# Patient Record
Sex: Male | Born: 1950 | Race: White | Hispanic: No | Marital: Married | Smoking: Former smoker
Health system: Southern US, Community
[De-identification: ages and names within clinical notes are randomized; demographics above are authoritative.]

## PROBLEM LIST (undated history)

## (undated) DIAGNOSIS — Z8659 Personal history of other mental and behavioral disorders: Secondary | ICD-10-CM

## (undated) DIAGNOSIS — E538 Deficiency of other specified B group vitamins: Secondary | ICD-10-CM

## (undated) DIAGNOSIS — F419 Anxiety disorder, unspecified: Secondary | ICD-10-CM

## (undated) DIAGNOSIS — N400 Enlarged prostate without lower urinary tract symptoms: Secondary | ICD-10-CM

## (undated) DIAGNOSIS — F0280 Dementia in other diseases classified elsewhere without behavioral disturbance: Secondary | ICD-10-CM

## (undated) HISTORY — DX: Benign prostatic hyperplasia without lower urinary tract symptoms: N40.0

## (undated) HISTORY — DX: Deficiency of other specified B group vitamins: E53.8

## (undated) HISTORY — PX: HERNIA REPAIR: SHX51

## (undated) HISTORY — DX: Anxiety disorder, unspecified: F41.9

## (undated) HISTORY — DX: Personal history of other mental and behavioral disorders: Z86.59

## (undated) HISTORY — PX: SKIN CANCER EXCISION: SHX779

## (undated) HISTORY — DX: Dementia in other diseases classified elsewhere, unspecified severity, without behavioral disturbance, psychotic disturbance, mood disturbance, and anxiety: F02.80

## (undated) HISTORY — PX: CYST EXCISION: SHX5701

---

## 2013-05-24 ENCOUNTER — Emergency Department: Payer: Self-pay | Admitting: Internal Medicine

## 2013-05-24 LAB — URINALYSIS, COMPLETE
Bacteria: NONE SEEN
Bilirubin,UR: NEGATIVE
Blood: NEGATIVE
Nitrite: NEGATIVE
Protein: NEGATIVE
RBC,UR: <1 /HPF (ref 0–5)
Specific Gravity: 1.021 (ref 1.003–1.030)
Squamous Epithelial: NONE SEEN

## 2019-07-09 DIAGNOSIS — H5202 Hypermetropia, left eye: Secondary | ICD-10-CM | POA: Diagnosis not present

## 2019-07-09 DIAGNOSIS — Z0100 Encounter for examination of eyes and vision without abnormal findings: Secondary | ICD-10-CM | POA: Diagnosis not present

## 2019-08-10 DIAGNOSIS — Z0000 Encounter for general adult medical examination without abnormal findings: Secondary | ICD-10-CM | POA: Diagnosis not present

## 2019-08-10 DIAGNOSIS — N529 Male erectile dysfunction, unspecified: Secondary | ICD-10-CM | POA: Diagnosis not present

## 2019-08-10 DIAGNOSIS — Z87891 Personal history of nicotine dependence: Secondary | ICD-10-CM | POA: Diagnosis not present

## 2019-08-10 DIAGNOSIS — E785 Hyperlipidemia, unspecified: Secondary | ICD-10-CM | POA: Diagnosis not present

## 2019-08-10 DIAGNOSIS — Z7289 Other problems related to lifestyle: Secondary | ICD-10-CM | POA: Diagnosis not present

## 2019-08-10 DIAGNOSIS — Z125 Encounter for screening for malignant neoplasm of prostate: Secondary | ICD-10-CM | POA: Diagnosis not present

## 2019-08-10 DIAGNOSIS — N400 Enlarged prostate without lower urinary tract symptoms: Secondary | ICD-10-CM | POA: Diagnosis not present

## 2020-08-11 DIAGNOSIS — E785 Hyperlipidemia, unspecified: Secondary | ICD-10-CM | POA: Diagnosis not present

## 2020-08-11 DIAGNOSIS — Z125 Encounter for screening for malignant neoplasm of prostate: Secondary | ICD-10-CM | POA: Diagnosis not present

## 2020-08-11 DIAGNOSIS — R413 Other amnesia: Secondary | ICD-10-CM | POA: Diagnosis not present

## 2020-08-11 DIAGNOSIS — F1021 Alcohol dependence, in remission: Secondary | ICD-10-CM | POA: Diagnosis not present

## 2020-08-11 DIAGNOSIS — N529 Male erectile dysfunction, unspecified: Secondary | ICD-10-CM | POA: Diagnosis not present

## 2020-08-11 DIAGNOSIS — N400 Enlarged prostate without lower urinary tract symptoms: Secondary | ICD-10-CM | POA: Diagnosis not present

## 2020-08-11 DIAGNOSIS — Z0000 Encounter for general adult medical examination without abnormal findings: Secondary | ICD-10-CM | POA: Diagnosis not present

## 2020-09-18 DIAGNOSIS — Z8659 Personal history of other mental and behavioral disorders: Secondary | ICD-10-CM | POA: Diagnosis not present

## 2020-09-18 DIAGNOSIS — F419 Anxiety disorder, unspecified: Secondary | ICD-10-CM | POA: Diagnosis not present

## 2020-09-18 DIAGNOSIS — R413 Other amnesia: Secondary | ICD-10-CM | POA: Diagnosis not present

## 2020-09-21 DIAGNOSIS — F419 Anxiety disorder, unspecified: Secondary | ICD-10-CM | POA: Insufficient documentation

## 2020-11-03 DIAGNOSIS — H5213 Myopia, bilateral: Secondary | ICD-10-CM | POA: Diagnosis not present

## 2020-11-06 DIAGNOSIS — Z8659 Personal history of other mental and behavioral disorders: Secondary | ICD-10-CM | POA: Diagnosis not present

## 2020-11-06 DIAGNOSIS — F419 Anxiety disorder, unspecified: Secondary | ICD-10-CM | POA: Diagnosis not present

## 2020-11-06 DIAGNOSIS — R413 Other amnesia: Secondary | ICD-10-CM | POA: Diagnosis not present

## 2020-11-10 DIAGNOSIS — H02835 Dermatochalasis of left lower eyelid: Secondary | ICD-10-CM | POA: Diagnosis not present

## 2020-11-10 DIAGNOSIS — H02831 Dermatochalasis of right upper eyelid: Secondary | ICD-10-CM | POA: Diagnosis not present

## 2020-11-10 DIAGNOSIS — H02832 Dermatochalasis of right lower eyelid: Secondary | ICD-10-CM | POA: Diagnosis not present

## 2020-11-10 DIAGNOSIS — H02834 Dermatochalasis of left upper eyelid: Secondary | ICD-10-CM | POA: Diagnosis not present

## 2020-11-10 DIAGNOSIS — H25813 Combined forms of age-related cataract, bilateral: Secondary | ICD-10-CM | POA: Diagnosis not present

## 2021-01-07 DIAGNOSIS — H2512 Age-related nuclear cataract, left eye: Secondary | ICD-10-CM | POA: Diagnosis not present

## 2021-01-07 DIAGNOSIS — H25012 Cortical age-related cataract, left eye: Secondary | ICD-10-CM | POA: Diagnosis not present

## 2021-01-07 DIAGNOSIS — H52222 Regular astigmatism, left eye: Secondary | ICD-10-CM | POA: Diagnosis not present

## 2021-01-08 DIAGNOSIS — H2512 Age-related nuclear cataract, left eye: Secondary | ICD-10-CM | POA: Diagnosis not present

## 2021-01-28 DIAGNOSIS — H2511 Age-related nuclear cataract, right eye: Secondary | ICD-10-CM | POA: Diagnosis not present

## 2021-01-28 DIAGNOSIS — H52221 Regular astigmatism, right eye: Secondary | ICD-10-CM | POA: Diagnosis not present

## 2021-02-09 DIAGNOSIS — Z8659 Personal history of other mental and behavioral disorders: Secondary | ICD-10-CM | POA: Diagnosis not present

## 2021-02-09 DIAGNOSIS — R413 Other amnesia: Secondary | ICD-10-CM | POA: Diagnosis not present

## 2021-02-09 DIAGNOSIS — F419 Anxiety disorder, unspecified: Secondary | ICD-10-CM | POA: Diagnosis not present

## 2021-03-23 DIAGNOSIS — E538 Deficiency of other specified B group vitamins: Secondary | ICD-10-CM | POA: Insufficient documentation

## 2021-07-27 DIAGNOSIS — F419 Anxiety disorder, unspecified: Secondary | ICD-10-CM | POA: Diagnosis not present

## 2021-07-27 DIAGNOSIS — R413 Other amnesia: Secondary | ICD-10-CM | POA: Diagnosis not present

## 2021-07-27 DIAGNOSIS — Z8659 Personal history of other mental and behavioral disorders: Secondary | ICD-10-CM | POA: Diagnosis not present

## 2021-07-27 DIAGNOSIS — E538 Deficiency of other specified B group vitamins: Secondary | ICD-10-CM | POA: Diagnosis not present

## 2021-07-30 ENCOUNTER — Other Ambulatory Visit: Payer: Self-pay | Admitting: Neurology

## 2021-07-30 DIAGNOSIS — R413 Other amnesia: Secondary | ICD-10-CM

## 2021-08-12 DIAGNOSIS — N400 Enlarged prostate without lower urinary tract symptoms: Secondary | ICD-10-CM | POA: Diagnosis not present

## 2021-08-12 DIAGNOSIS — R413 Other amnesia: Secondary | ICD-10-CM | POA: Diagnosis not present

## 2021-08-12 DIAGNOSIS — E785 Hyperlipidemia, unspecified: Secondary | ICD-10-CM | POA: Diagnosis not present

## 2021-08-12 DIAGNOSIS — N529 Male erectile dysfunction, unspecified: Secondary | ICD-10-CM | POA: Diagnosis not present

## 2021-08-12 DIAGNOSIS — Z1389 Encounter for screening for other disorder: Secondary | ICD-10-CM | POA: Diagnosis not present

## 2021-08-12 DIAGNOSIS — F419 Anxiety disorder, unspecified: Secondary | ICD-10-CM | POA: Diagnosis not present

## 2021-08-12 DIAGNOSIS — Z125 Encounter for screening for malignant neoplasm of prostate: Secondary | ICD-10-CM | POA: Diagnosis not present

## 2021-08-12 DIAGNOSIS — Z0000 Encounter for general adult medical examination without abnormal findings: Secondary | ICD-10-CM | POA: Diagnosis not present

## 2021-08-12 DIAGNOSIS — F1021 Alcohol dependence, in remission: Secondary | ICD-10-CM | POA: Diagnosis not present

## 2021-08-14 ENCOUNTER — Ambulatory Visit
Admission: RE | Admit: 2021-08-14 | Discharge: 2021-08-14 | Disposition: A | Discharge status: Home / Self Care | Payer: Medicare HMO | Source: Ambulatory Visit | Attending: Neurology | Admitting: Neurology

## 2021-08-14 DIAGNOSIS — I6782 Cerebral ischemia: Secondary | ICD-10-CM | POA: Diagnosis not present

## 2021-08-14 DIAGNOSIS — R413 Other amnesia: Secondary | ICD-10-CM | POA: Diagnosis not present

## 2021-08-14 DIAGNOSIS — G319 Degenerative disease of nervous system, unspecified: Secondary | ICD-10-CM | POA: Diagnosis not present

## 2021-08-14 DIAGNOSIS — J329 Chronic sinusitis, unspecified: Secondary | ICD-10-CM | POA: Diagnosis not present

## 2021-10-19 DIAGNOSIS — Z8659 Personal history of other mental and behavioral disorders: Secondary | ICD-10-CM | POA: Diagnosis not present

## 2021-10-19 DIAGNOSIS — F419 Anxiety disorder, unspecified: Secondary | ICD-10-CM | POA: Diagnosis not present

## 2021-10-19 DIAGNOSIS — R413 Other amnesia: Secondary | ICD-10-CM | POA: Diagnosis not present

## 2021-10-19 DIAGNOSIS — E538 Deficiency of other specified B group vitamins: Secondary | ICD-10-CM | POA: Diagnosis not present

## 2021-12-10 DIAGNOSIS — H6121 Impacted cerumen, right ear: Secondary | ICD-10-CM | POA: Diagnosis not present

## 2021-12-10 DIAGNOSIS — J320 Chronic maxillary sinusitis: Secondary | ICD-10-CM | POA: Diagnosis not present

## 2021-12-15 ENCOUNTER — Other Ambulatory Visit: Payer: Self-pay | Admitting: Unknown Physician Specialty

## 2021-12-15 DIAGNOSIS — J320 Chronic maxillary sinusitis: Secondary | ICD-10-CM

## 2022-01-01 ENCOUNTER — Ambulatory Visit
Admission: RE | Admit: 2022-01-01 | Discharge: 2022-01-01 | Disposition: A | Discharge status: Home / Self Care | Payer: Medicare HMO | Source: Ambulatory Visit | Attending: Unknown Physician Specialty | Admitting: Unknown Physician Specialty

## 2022-01-01 DIAGNOSIS — J320 Chronic maxillary sinusitis: Secondary | ICD-10-CM | POA: Diagnosis not present

## 2022-01-11 DIAGNOSIS — J320 Chronic maxillary sinusitis: Secondary | ICD-10-CM | POA: Diagnosis not present

## 2022-01-18 DIAGNOSIS — E538 Deficiency of other specified B group vitamins: Secondary | ICD-10-CM | POA: Diagnosis not present

## 2022-01-18 DIAGNOSIS — R413 Other amnesia: Secondary | ICD-10-CM | POA: Diagnosis not present

## 2022-01-18 DIAGNOSIS — F419 Anxiety disorder, unspecified: Secondary | ICD-10-CM | POA: Diagnosis not present

## 2022-01-18 DIAGNOSIS — Z8659 Personal history of other mental and behavioral disorders: Secondary | ICD-10-CM | POA: Diagnosis not present

## 2022-02-22 DIAGNOSIS — Z0100 Encounter for examination of eyes and vision without abnormal findings: Secondary | ICD-10-CM | POA: Diagnosis not present

## 2022-02-22 DIAGNOSIS — H5203 Hypermetropia, bilateral: Secondary | ICD-10-CM | POA: Diagnosis not present

## 2022-05-19 DIAGNOSIS — E538 Deficiency of other specified B group vitamins: Secondary | ICD-10-CM | POA: Diagnosis not present

## 2022-05-19 DIAGNOSIS — G3183 Dementia with Lewy bodies: Secondary | ICD-10-CM | POA: Diagnosis not present

## 2022-05-19 DIAGNOSIS — F02A2 Dementia in other diseases classified elsewhere, mild, with psychotic disturbance: Secondary | ICD-10-CM | POA: Diagnosis not present

## 2022-06-23 DIAGNOSIS — Z8659 Personal history of other mental and behavioral disorders: Secondary | ICD-10-CM | POA: Diagnosis not present

## 2022-06-23 DIAGNOSIS — F419 Anxiety disorder, unspecified: Secondary | ICD-10-CM | POA: Diagnosis not present

## 2022-06-23 DIAGNOSIS — R413 Other amnesia: Secondary | ICD-10-CM | POA: Diagnosis not present

## 2022-06-23 DIAGNOSIS — G4752 REM sleep behavior disorder: Secondary | ICD-10-CM | POA: Diagnosis not present

## 2022-08-18 DIAGNOSIS — F1021 Alcohol dependence, in remission: Secondary | ICD-10-CM | POA: Diagnosis not present

## 2022-08-18 DIAGNOSIS — Z125 Encounter for screening for malignant neoplasm of prostate: Secondary | ICD-10-CM | POA: Diagnosis not present

## 2022-08-18 DIAGNOSIS — N400 Enlarged prostate without lower urinary tract symptoms: Secondary | ICD-10-CM | POA: Diagnosis not present

## 2022-08-18 DIAGNOSIS — Z1331 Encounter for screening for depression: Secondary | ICD-10-CM | POA: Diagnosis not present

## 2022-08-18 DIAGNOSIS — E785 Hyperlipidemia, unspecified: Secondary | ICD-10-CM | POA: Diagnosis not present

## 2022-08-18 DIAGNOSIS — G309 Alzheimer's disease, unspecified: Secondary | ICD-10-CM | POA: Diagnosis not present

## 2022-08-18 DIAGNOSIS — Z0000 Encounter for general adult medical examination without abnormal findings: Secondary | ICD-10-CM | POA: Diagnosis not present

## 2022-08-18 DIAGNOSIS — N529 Male erectile dysfunction, unspecified: Secondary | ICD-10-CM | POA: Diagnosis not present

## 2022-08-18 DIAGNOSIS — K5900 Constipation, unspecified: Secondary | ICD-10-CM | POA: Diagnosis not present

## 2022-09-21 DIAGNOSIS — Z8659 Personal history of other mental and behavioral disorders: Secondary | ICD-10-CM | POA: Diagnosis not present

## 2022-09-21 DIAGNOSIS — E538 Deficiency of other specified B group vitamins: Secondary | ICD-10-CM | POA: Diagnosis not present

## 2022-09-21 DIAGNOSIS — G4752 REM sleep behavior disorder: Secondary | ICD-10-CM | POA: Diagnosis not present

## 2022-09-21 DIAGNOSIS — F419 Anxiety disorder, unspecified: Secondary | ICD-10-CM | POA: Diagnosis not present

## 2022-09-21 DIAGNOSIS — R413 Other amnesia: Secondary | ICD-10-CM | POA: Diagnosis not present

## 2022-10-06 DIAGNOSIS — R972 Elevated prostate specific antigen [PSA]: Secondary | ICD-10-CM | POA: Diagnosis not present

## 2022-10-19 DIAGNOSIS — D0439 Carcinoma in situ of skin of other parts of face: Secondary | ICD-10-CM | POA: Diagnosis not present

## 2022-10-19 DIAGNOSIS — D485 Neoplasm of uncertain behavior of skin: Secondary | ICD-10-CM | POA: Diagnosis not present

## 2022-10-19 DIAGNOSIS — C4441 Basal cell carcinoma of skin of scalp and neck: Secondary | ICD-10-CM | POA: Diagnosis not present

## 2022-10-25 DIAGNOSIS — Z1211 Encounter for screening for malignant neoplasm of colon: Secondary | ICD-10-CM | POA: Diagnosis not present

## 2022-11-01 LAB — COLOGUARD: COLOGUARD: NEGATIVE

## 2022-11-01 LAB — EXTERNAL GENERIC LAB PROCEDURE: COLOGUARD: NEGATIVE

## 2022-11-10 DIAGNOSIS — C44329 Squamous cell carcinoma of skin of other parts of face: Secondary | ICD-10-CM | POA: Diagnosis not present

## 2022-11-10 DIAGNOSIS — D0439 Carcinoma in situ of skin of other parts of face: Secondary | ICD-10-CM | POA: Diagnosis not present

## 2022-11-24 DIAGNOSIS — C4491 Basal cell carcinoma of skin, unspecified: Secondary | ICD-10-CM | POA: Diagnosis not present

## 2022-11-24 DIAGNOSIS — C4441 Basal cell carcinoma of skin of scalp and neck: Secondary | ICD-10-CM | POA: Diagnosis not present

## 2022-11-30 DIAGNOSIS — G3183 Dementia with Lewy bodies: Secondary | ICD-10-CM | POA: Diagnosis not present

## 2022-11-30 DIAGNOSIS — F02A4 Dementia in other diseases classified elsewhere, mild, with anxiety: Secondary | ICD-10-CM | POA: Diagnosis not present

## 2022-11-30 DIAGNOSIS — F419 Anxiety disorder, unspecified: Secondary | ICD-10-CM | POA: Diagnosis not present

## 2022-11-30 DIAGNOSIS — Z636 Dependent relative needing care at home: Secondary | ICD-10-CM | POA: Diagnosis not present

## 2022-11-30 DIAGNOSIS — F0280 Dementia in other diseases classified elsewhere without behavioral disturbance: Secondary | ICD-10-CM | POA: Diagnosis not present

## 2022-11-30 DIAGNOSIS — G309 Alzheimer's disease, unspecified: Secondary | ICD-10-CM | POA: Diagnosis not present

## 2022-11-30 DIAGNOSIS — Z8659 Personal history of other mental and behavioral disorders: Secondary | ICD-10-CM | POA: Diagnosis not present

## 2022-12-07 ENCOUNTER — Encounter: Payer: Self-pay | Admitting: Urology

## 2022-12-07 ENCOUNTER — Ambulatory Visit: Payer: Medicare HMO | Admitting: Urology

## 2022-12-07 VITALS — BP 127/73 | HR 60 | Ht 70.0 in | Wt 200.4 lb

## 2022-12-07 DIAGNOSIS — R972 Elevated prostate specific antigen [PSA]: Secondary | ICD-10-CM | POA: Diagnosis not present

## 2022-12-07 DIAGNOSIS — R350 Frequency of micturition: Secondary | ICD-10-CM | POA: Diagnosis not present

## 2022-12-07 LAB — URINALYSIS, COMPLETE
Bilirubin, UA: NEGATIVE
Glucose, UA: NEGATIVE
Ketones, UA: NEGATIVE
Leukocytes,UA: NEGATIVE
Nitrite, UA: NEGATIVE
RBC, UA: NEGATIVE
Specific Gravity, UA: 1.02 (ref 1.005–1.030)
Urobilinogen, Ur: 1 mg/dL (ref 0.2–1.0)
pH, UA: 7 (ref 5.0–7.5)

## 2022-12-07 LAB — MICROSCOPIC EXAMINATION

## 2022-12-07 NOTE — Progress Notes (Signed)
I,Amy L Pierron,acting as a scribe for Vanna Scotland, MD.,have documented all relevant documentation on the behalf of Vanna Scotland, MD,as directed by  Vanna Scotland, MD while in the presence of Vanna Scotland, MD.  12/07/2022 2:45 PM   John Burch August 17, 1950 [id number]  Referring provider: Marina Goodell, MD [address] [place],  [place] [address]  Chief Complaint  Patient presents with   Establish Care   Elevated PSA    HPI: 72 year-old male referred for further evaluation of elevated PSA.  He had a PSA on 10/06/2022 as part of his routine labs, which was known to be 4.11. Prior to that, it had been checked on 08/18/22, which was 4.25. On 08/12/2021, his PSA was 2.98.   His medical comorbidities include Lewy body dementia.  He's on Sildenafil for erectile dysfunction as well as on Flomax.  He is accompanied by his wife. She said when they returned from a trip to [place] in 2019 his prostate was enlarged and was put on Flomax.   He has urgency and leaking sometimes. Since he started taking Clonazepam his frequency has decreased some.  PMH: Past Medical History:  Diagnosis Date   Anxiety    B12 deficiency    BPH (benign prostatic hyperplasia)    History of depression    Lewy body dementia (HCC)     Surgical History: Past Surgical History:  Procedure Laterality Date   CYST EXCISION     HERNIA REPAIR     SKIN CANCER EXCISION      Home Medications:  Allergies as of 12/07/2022       Reactions   Other    Goose berry        Medication List        Accurate as of December 07, 2022  2:45 PM. If you have any questions, ask your nurse or doctor.          busPIRone 7.5 MG tablet Commonly known as: BUSPAR Take 7.5 mg by mouth 2 (two) times daily.   clonazePAM 0.5 MG tablet Commonly known as: KLONOPIN Take 0.25 mg by mouth at bedtime.   cyanocobalamin 1000 MCG tablet Commonly known as: VITAMIN B12 Take by mouth daily.   donepezil 10 MG  tablet Commonly known as: ARICEPT Take 1 tablet by mouth at bedtime.   escitalopram 20 MG tablet Commonly known as: LEXAPRO Take 20 mg by mouth daily.   LUTEIN PO Take by mouth.   NON FORMULARY Physilium HUSK daily   tamsulosin 0.4 MG Caps capsule Commonly known as: FLOMAX Take 0.4 mg by mouth daily.   Vitamin D3 50 MCG (2000 UT) capsule Take by mouth.        Allergies:  Allergies  Allergen Reactions   Other     Goose berry    Social History:  reports that he has quit smoking. His smoking use included cigarettes. He has never used smokeless tobacco. He reports current alcohol use. He reports that he does not use drugs.   Physical Exam: BP 127/73   Pulse 60   Ht 5\' 10"  (1.778 m)   Wt 200 lb 6 oz (90.9 kg)   BMI 28.75 kg/m   Constitutional:  Alert and oriented, No acute distress. HEENT: [place] AT, moist mucus membranes.  Trachea midline, no masses. GU: Large prostate, no nodules, lumps, or bumps. Neurologic: Grossly intact, no focal deficits, moving all 4 extremities. Psychiatric: Normal mood and affect.  Assessment & Plan:    Elevated PSA  -  We reviewed the implications of an elevated PSA and the uncertainty surrounding it. In general, a man's PSA increases with age and is produced by both normal and cancerous prostate tissue. Differential for elevated PSA is BPH, prostate cancer, infection, recent intercourse/ejaculation, prostate infarction, recent urethroscopic manipulation (foley placement/cystoscopy) and prostatitis. Management of an elevated PSA can include observation or prostate biopsy and wediscussed this in detail. We discussed that indications for prostate biopsy are defined by age and race specific PSA cutoffs as well as a PSA velocity of 0.75/year.  - UA today to check for underlying issues. Prostate size in harmony with a higher PSA.   - On Flomax since 2019 and plan to continue taking it.  2. Urinary frequency  - On occasion; not bothersome at this  time. Discussed the addition of Finasteride if symptoms worsen in the future.   Return in about 1 year (around 12/07/2023) for PSA.    [facility] [address] [place], [place] [address] [phone]

## 2022-12-13 IMAGING — MR MR HEAD W/O CM
12 series · 48 of 48 positions shown · non-contrast
Comparison: No pertinent prior exams available for comparison.

CLINICAL DATA: Provided history: Memory loss. Additional history
provided by scanning technologist: Memory loss, confusion,
depression, vision problems for 2 years.

EXAM:
MRI HEAD WITHOUT CONTRAST
TECHNIQUE: Multiplanar, multiecho pulse sequences of the brain and surrounding
structures were obtained without intravenous contrast.

[Series 5: T1 · sagittal · 4.0mm · 0.78mm/px · 1 of 35 slices shown (1 of 2)]
[im 1/35]
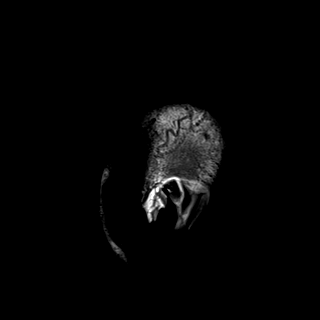

[Series 6: DWI · axial · 3.0mm · 0.98mm/px · z∈[-96,+77]mm · 11 of 196 slices shown (1 of 3)]
[im 1/196]
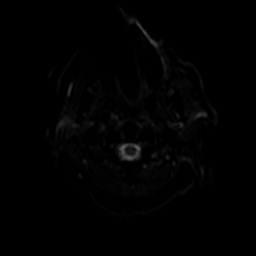
[im 20/196]
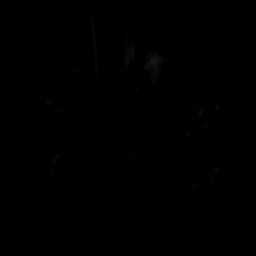
[im 40/196]
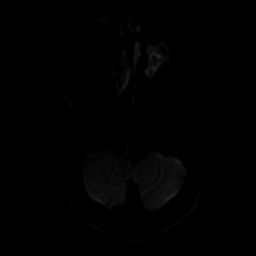
[im 59/196]
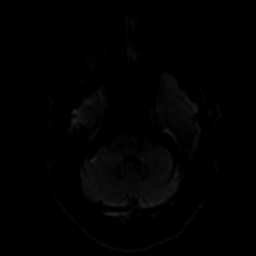
[im 79/196]
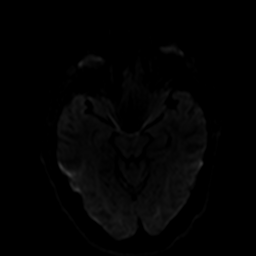
[im 98/196]
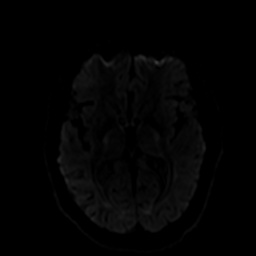
[im 118/196]
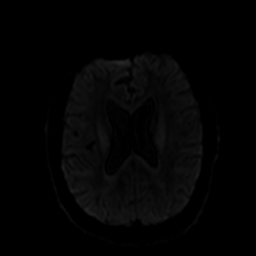
[im 137/196]
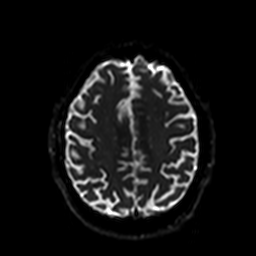
[im 157/196]
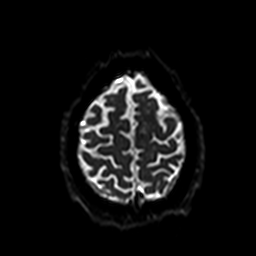
[im 176/196]
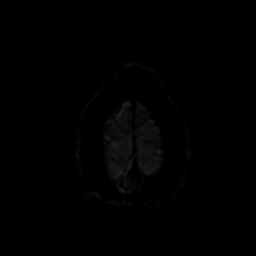
[im 196/196]
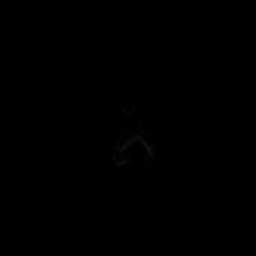

[Series 7: ax dwi_tracew · axial · 3.0mm · 0.98mm/px · z∈[-96,+77]mm · 5 of 98 slices shown]
[im 1/98]
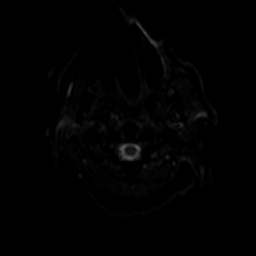
[im 25/98]
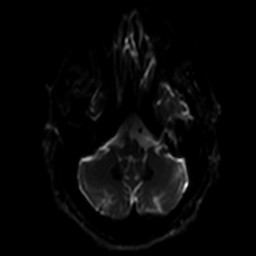
[im 49/98]
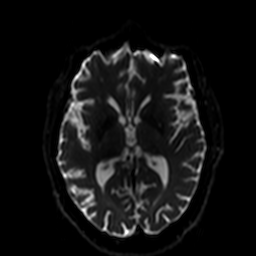
[im 73/98]
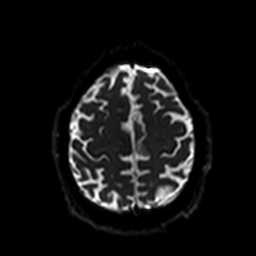
[im 98/98]
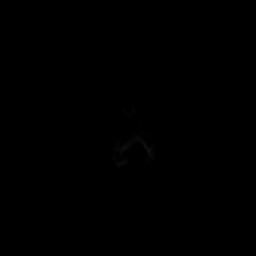

[Series 8: ax dwi_adc · axial · 3.0mm · 0.98mm/px · z∈[-96,+77]mm · 3 of 48 slices shown]
[im 1/48]
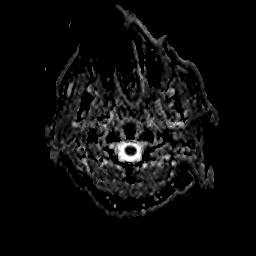
[im 24/48]
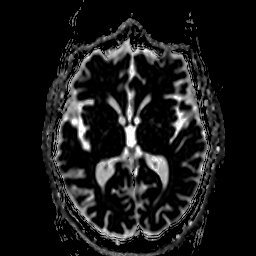
[im 48/48]
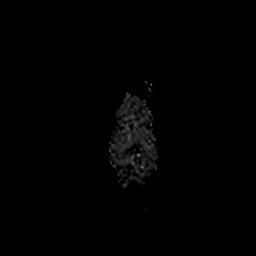

[Series 9: DWI · coronal · 5.0mm · 1.44mm/px · 4 of 74 slices shown (2 of 3)]
[im 1/74]
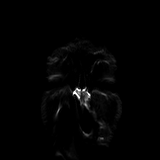
[im 25/74]
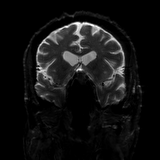
[im 49/74]
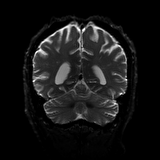
[im 74/74]
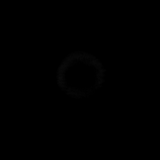

[Series 10: DWI · coronal · 5.0mm · 1.44mm/px · 2 of 37 slices shown (3 of 3)]
[im 1/37]
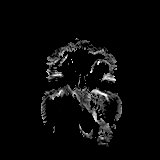
[im 37/37]
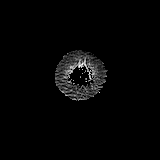

[Series 11: T2 · axial · 4.0mm · 0.39mm/px · z∈[-91,+75]mm · 2 of 33 slices shown (1 of 2)]
[im 1/33]
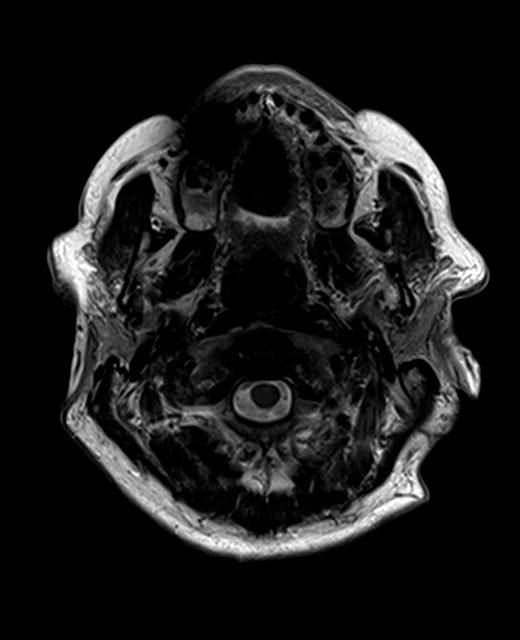
[im 33/33]
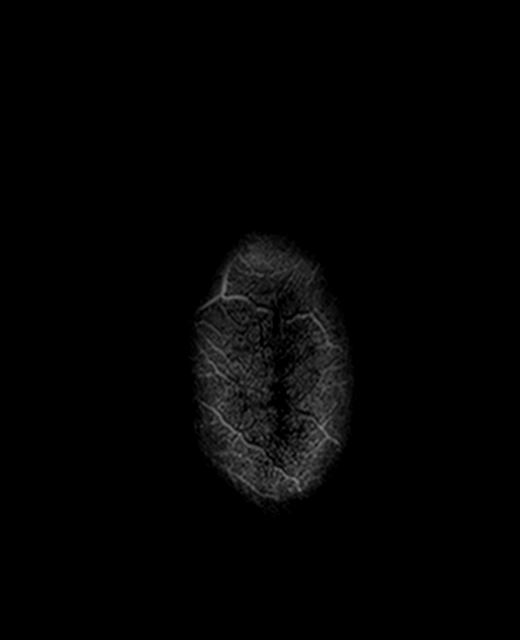

[Series 12: FLAIR · axial · 3.0mm · 0.78mm/px · z∈[-93,+74]mm · 2 of 29 slices shown]
[im 1/29]
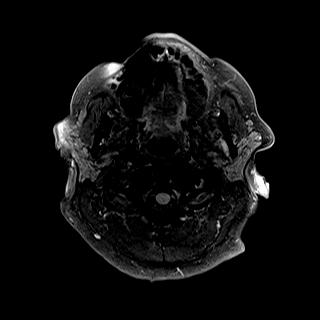
[im 29/29]
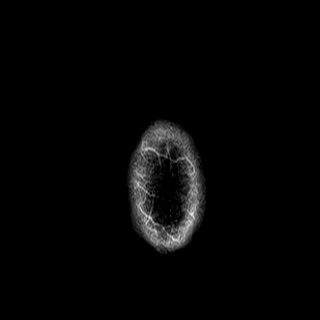

[Series 13: mip_images(sw) · axial · 24.0mm · 0.98mm/px · z∈[-79,+64]mm · 3 of 49 slices shown]
[im 1/49]
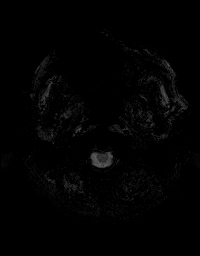
[im 25/49]
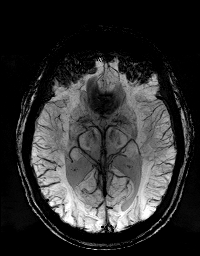
[im 49/49]
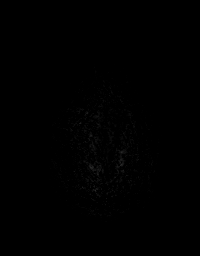

[Series 14: swi_images · axial · 3.0mm · 0.98mm/px · z∈[-90,+75]mm · 3 of 56 slices shown]
[im 1/56]
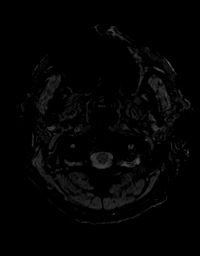
[im 28/56]
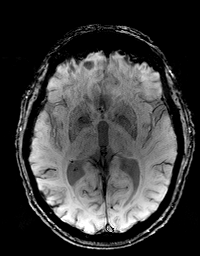
[im 56/56]
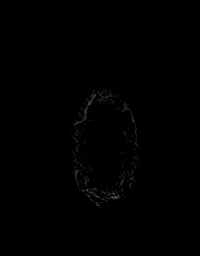

[Series 15: T1 · axial · 1.0mm · 0.98mm/px · z∈[-96,+79]mm · 10 of 176 slices shown (2 of 2)]
[im 1/176]
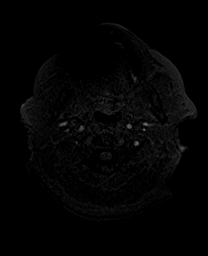
[im 20/176]
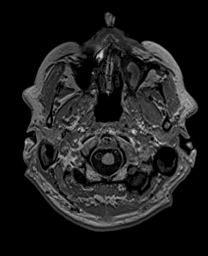
[im 39/176]
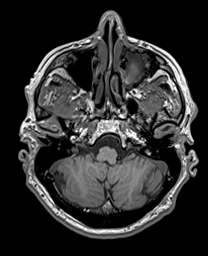
[im 59/176]
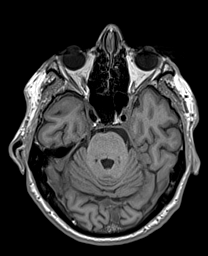
[im 78/176]
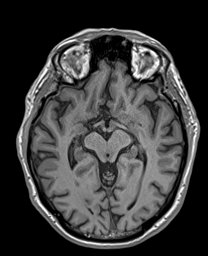
[im 98/176]
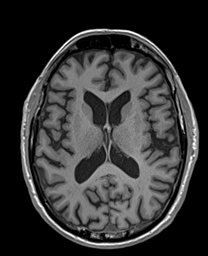
[im 117/176]
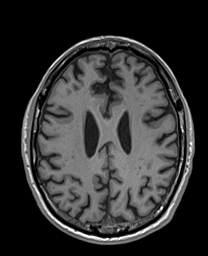
[im 137/176]
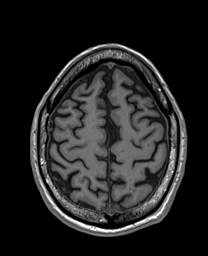
[im 156/176]
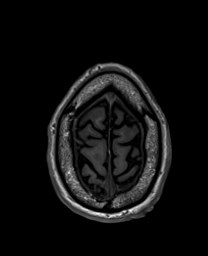
[im 176/176]
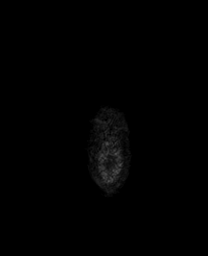

[Series 16: T2 · coronal · 4.5mm · 0.36mm/px · 2 of 33 slices shown (2 of 2)]
[im 1/33]
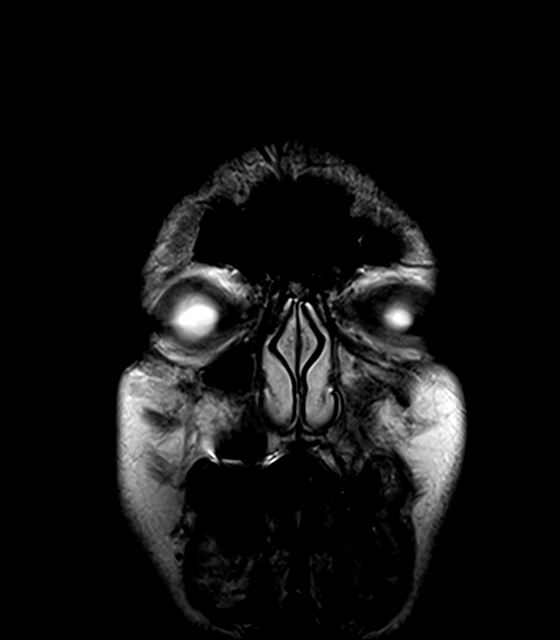
[im 33/33]
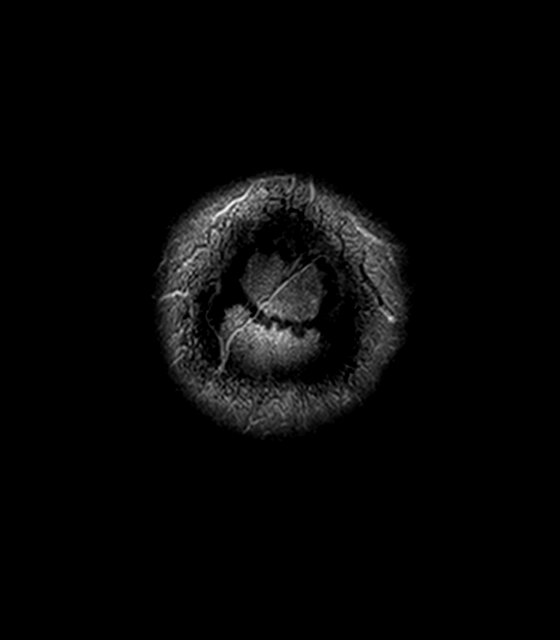

[48 of 48 positions shown; findings below may reference images not displayed]

FINDINGS: Brain:

Mild-to-moderate cerebral atrophy without appreciable lobar
predominance. Comparatively mild cerebellar atrophy.

Mild multifocal T2 FLAIR hyperintense signal abnormality within the
cerebral white matter, nonspecific but compatible with chronic small
vessel ischemic disease.

There is no acute infarct.

No evidence of an intracranial mass.

No chronic intracranial blood products.

No extra-axial fluid collection.

No midline shift.

Vascular: Maintained flow voids within the proximal large arterial
vessels.

Skull and upper cervical spine: No focal suspicious marrow lesion.

Sinuses/Orbits: Visualized orbits show no acute finding. Bilateral
ocular lens replacements. Trace mucosal thickening within the
bilateral ethmoid sinuses. Mild mucosal thickening, and small mucous
retention cyst, within the right maxillary sinus. Complete mixed
signal intensity opacification of the left maxillary sinus with
associated chronic reactive osteitis.

Other: Trace fluid within the bilateral mastoid air cells.
IMPRESSION: No evidence of acute intracranial abnormality.

Mild chronic small-vessel ischemic changes within the cerebral white
matter.

Mild-to-moderate generalized cerebral atrophy. Comparatively mild
cerebellar atrophy.

Paranasal sinus disease, as described.

## 2023-01-13 DIAGNOSIS — L300 Nummular dermatitis: Secondary | ICD-10-CM | POA: Diagnosis not present

## 2023-01-13 DIAGNOSIS — L218 Other seborrheic dermatitis: Secondary | ICD-10-CM | POA: Diagnosis not present

## 2023-01-24 DIAGNOSIS — Z8659 Personal history of other mental and behavioral disorders: Secondary | ICD-10-CM | POA: Diagnosis not present

## 2023-01-24 DIAGNOSIS — R413 Other amnesia: Secondary | ICD-10-CM | POA: Diagnosis not present

## 2023-01-24 DIAGNOSIS — G4752 REM sleep behavior disorder: Secondary | ICD-10-CM | POA: Diagnosis not present

## 2023-01-24 DIAGNOSIS — F419 Anxiety disorder, unspecified: Secondary | ICD-10-CM | POA: Diagnosis not present

## 2023-01-24 DIAGNOSIS — E538 Deficiency of other specified B group vitamins: Secondary | ICD-10-CM | POA: Diagnosis not present

## 2023-03-15 DIAGNOSIS — F419 Anxiety disorder, unspecified: Secondary | ICD-10-CM | POA: Diagnosis not present

## 2023-03-15 DIAGNOSIS — E538 Deficiency of other specified B group vitamins: Secondary | ICD-10-CM | POA: Diagnosis not present

## 2023-03-15 DIAGNOSIS — R441 Visual hallucinations: Secondary | ICD-10-CM | POA: Diagnosis not present

## 2023-03-15 DIAGNOSIS — R413 Other amnesia: Secondary | ICD-10-CM | POA: Diagnosis not present

## 2023-03-15 DIAGNOSIS — G4752 REM sleep behavior disorder: Secondary | ICD-10-CM | POA: Diagnosis not present

## 2023-03-15 DIAGNOSIS — F03918 Unspecified dementia, unspecified severity, with other behavioral disturbance: Secondary | ICD-10-CM | POA: Diagnosis not present

## 2023-03-15 DIAGNOSIS — Z8659 Personal history of other mental and behavioral disorders: Secondary | ICD-10-CM | POA: Diagnosis not present

## 2023-05-02 DIAGNOSIS — L218 Other seborrheic dermatitis: Secondary | ICD-10-CM | POA: Diagnosis not present

## 2023-05-02 DIAGNOSIS — Z872 Personal history of diseases of the skin and subcutaneous tissue: Secondary | ICD-10-CM | POA: Diagnosis not present

## 2023-05-02 DIAGNOSIS — L570 Actinic keratosis: Secondary | ICD-10-CM | POA: Diagnosis not present

## 2023-05-02 DIAGNOSIS — Z859 Personal history of malignant neoplasm, unspecified: Secondary | ICD-10-CM | POA: Diagnosis not present

## 2023-05-02 DIAGNOSIS — L578 Other skin changes due to chronic exposure to nonionizing radiation: Secondary | ICD-10-CM | POA: Diagnosis not present

## 2023-05-02 DIAGNOSIS — Z85828 Personal history of other malignant neoplasm of skin: Secondary | ICD-10-CM | POA: Diagnosis not present

## 2023-05-23 DIAGNOSIS — R441 Visual hallucinations: Secondary | ICD-10-CM | POA: Diagnosis not present

## 2023-05-23 DIAGNOSIS — F03918 Unspecified dementia, unspecified severity, with other behavioral disturbance: Secondary | ICD-10-CM | POA: Diagnosis not present

## 2023-05-23 DIAGNOSIS — Z8659 Personal history of other mental and behavioral disorders: Secondary | ICD-10-CM | POA: Diagnosis not present

## 2023-05-23 DIAGNOSIS — E538 Deficiency of other specified B group vitamins: Secondary | ICD-10-CM | POA: Diagnosis not present

## 2023-05-23 DIAGNOSIS — F02A2 Dementia in other diseases classified elsewhere, mild, with psychotic disturbance: Secondary | ICD-10-CM | POA: Diagnosis not present

## 2023-05-23 DIAGNOSIS — R413 Other amnesia: Secondary | ICD-10-CM | POA: Diagnosis not present

## 2023-05-23 DIAGNOSIS — G3183 Dementia with Lewy bodies: Secondary | ICD-10-CM | POA: Diagnosis not present

## 2023-05-23 DIAGNOSIS — F419 Anxiety disorder, unspecified: Secondary | ICD-10-CM | POA: Diagnosis not present

## 2023-05-23 DIAGNOSIS — G4752 REM sleep behavior disorder: Secondary | ICD-10-CM | POA: Diagnosis not present

## 2023-06-17 DIAGNOSIS — L218 Other seborrheic dermatitis: Secondary | ICD-10-CM | POA: Diagnosis not present

## 2023-06-17 DIAGNOSIS — L570 Actinic keratosis: Secondary | ICD-10-CM | POA: Diagnosis not present

## 2023-08-03 DIAGNOSIS — R413 Other amnesia: Secondary | ICD-10-CM | POA: Diagnosis not present

## 2023-08-03 DIAGNOSIS — Z8659 Personal history of other mental and behavioral disorders: Secondary | ICD-10-CM | POA: Diagnosis not present

## 2023-08-03 DIAGNOSIS — G4752 REM sleep behavior disorder: Secondary | ICD-10-CM | POA: Diagnosis not present

## 2023-08-03 DIAGNOSIS — F02A2 Dementia in other diseases classified elsewhere, mild, with psychotic disturbance: Secondary | ICD-10-CM | POA: Diagnosis not present

## 2023-08-03 DIAGNOSIS — F419 Anxiety disorder, unspecified: Secondary | ICD-10-CM | POA: Diagnosis not present

## 2023-08-03 DIAGNOSIS — E538 Deficiency of other specified B group vitamins: Secondary | ICD-10-CM | POA: Diagnosis not present

## 2023-08-03 DIAGNOSIS — G3183 Dementia with Lewy bodies: Secondary | ICD-10-CM | POA: Diagnosis not present

## 2023-08-03 DIAGNOSIS — R351 Nocturia: Secondary | ICD-10-CM | POA: Diagnosis not present

## 2023-08-22 DIAGNOSIS — E785 Hyperlipidemia, unspecified: Secondary | ICD-10-CM | POA: Diagnosis not present

## 2023-08-22 DIAGNOSIS — Z125 Encounter for screening for malignant neoplasm of prostate: Secondary | ICD-10-CM | POA: Diagnosis not present

## 2023-08-22 DIAGNOSIS — F1021 Alcohol dependence, in remission: Secondary | ICD-10-CM | POA: Diagnosis not present

## 2023-08-22 DIAGNOSIS — Z1331 Encounter for screening for depression: Secondary | ICD-10-CM | POA: Diagnosis not present

## 2023-08-22 DIAGNOSIS — N529 Male erectile dysfunction, unspecified: Secondary | ICD-10-CM | POA: Diagnosis not present

## 2023-08-22 DIAGNOSIS — G309 Alzheimer's disease, unspecified: Secondary | ICD-10-CM | POA: Diagnosis not present

## 2023-08-22 DIAGNOSIS — F10280 Alcohol dependence with alcohol-induced anxiety disorder: Secondary | ICD-10-CM | POA: Diagnosis not present

## 2023-08-22 DIAGNOSIS — N400 Enlarged prostate without lower urinary tract symptoms: Secondary | ICD-10-CM | POA: Diagnosis not present

## 2023-08-22 DIAGNOSIS — Z0000 Encounter for general adult medical examination without abnormal findings: Secondary | ICD-10-CM | POA: Diagnosis not present

## 2023-09-13 DIAGNOSIS — Z8659 Personal history of other mental and behavioral disorders: Secondary | ICD-10-CM | POA: Diagnosis not present

## 2023-09-13 DIAGNOSIS — F419 Anxiety disorder, unspecified: Secondary | ICD-10-CM | POA: Diagnosis not present

## 2023-09-13 DIAGNOSIS — R441 Visual hallucinations: Secondary | ICD-10-CM | POA: Diagnosis not present

## 2023-09-13 DIAGNOSIS — G4752 REM sleep behavior disorder: Secondary | ICD-10-CM | POA: Diagnosis not present

## 2023-09-13 DIAGNOSIS — R413 Other amnesia: Secondary | ICD-10-CM | POA: Diagnosis not present

## 2023-09-15 DIAGNOSIS — L218 Other seborrheic dermatitis: Secondary | ICD-10-CM | POA: Diagnosis not present

## 2023-09-16 ENCOUNTER — Ambulatory Visit (INDEPENDENT_AMBULATORY_CARE_PROVIDER_SITE_OTHER): Payer: Medicare HMO | Admitting: Urology

## 2023-09-16 VITALS — BP 111/65 | HR 78 | Ht 70.0 in | Wt 196.6 lb

## 2023-09-16 DIAGNOSIS — N401 Enlarged prostate with lower urinary tract symptoms: Secondary | ICD-10-CM | POA: Diagnosis not present

## 2023-09-16 DIAGNOSIS — R972 Elevated prostate specific antigen [PSA]: Secondary | ICD-10-CM

## 2023-09-16 DIAGNOSIS — R351 Nocturia: Secondary | ICD-10-CM | POA: Diagnosis not present

## 2023-09-16 NOTE — Progress Notes (Signed)
 Marcelle Overlie Plume,acting as a scribe for Vanna Scotland, MD.,have documented all relevant documentation on the behalf of Vanna Scotland, MD,as directed by  Vanna Scotland, MD while in the presence of Vanna Scotland, MD.  09/16/23 2:53 PM   John Burch 10-Jan-1951 [id number]  Referring provider: Marina Goodell, MD [address] [place],  [place] [address]  Chief Complaint  Patient presents with   Establish Care   Elevated PSA    HPI: 73 y/o male referred for annual follow-up of elevated PSA levels. Comorbidities include Lewy Body dementia, with recent changes in diagnosis to Alzheimer's dementia and vascular dementia. It seems like his disease is progressing based on Dr. Daisy Blossom last note earlier this week. He is experiencing daily visual hallucinations, and there have been recent adjustments to his medications.  He is currently on sildenafil for ED and Flomax for BPH.   His most recent PSA on 08/22/2023 was 6.58, which is up from last year, 4.18. His rectal exam was unremarkable last year other than for enlargement.   His caregiver reports no urinary complaints, with him getting up once a night to urinate. The caregiver is concerned about the long-term use of Tamulosin and is considering a trial off the medication.   His wife today mentions that he seems to be progressing more quickly.  PSA trend: 08/22/2023     6.58 10/06/2022     4.18 08/18/2022     4.25 08/12/2021     2.98 08/11/2020     3.14 08/10/2019     3.36 08/14/2018     2.82 05/11/2018     2.97 08/09/2017     3.63 PMH: Past Medical History:  Diagnosis Date   Anxiety    B12 deficiency    BPH (benign prostatic hyperplasia)    History of depression    Lewy body dementia (HCC)     Surgical History: Past Surgical History:  Procedure Laterality Date   CYST EXCISION     HERNIA REPAIR     SKIN CANCER EXCISION      Home Medications:  Allergies as of 09/16/2023       Reactions   Other    Goose  berry        Medication List        Accurate as of September 16, 2023  2:53 PM. If you have any questions, ask your nurse or doctor.          STOP taking these medications    clonazePAM 0.5 MG tablet Commonly known as: KLONOPIN   LUTEIN PO       TAKE these medications    busPIRone 7.5 MG tablet Commonly known as: BUSPAR Take 7.5 mg by mouth 2 (two) times daily.   cyanocobalamin 1000 MCG tablet Commonly known as: VITAMIN B12 Take by mouth daily.   divalproex 125 MG DR tablet Commonly known as: DEPAKOTE Take by mouth. Take 125 mg in the morning and 125 mg in the middle and 250 mg at night   divalproex 250 MG DR tablet Commonly known as: DEPAKOTE Take 250 mg by mouth at bedtime.   donepezil 10 MG tablet Commonly known as: ARICEPT Take 1 tablet by mouth at bedtime.   escitalopram 20 MG tablet Commonly known as: LEXAPRO Take 20 mg by mouth daily.   Fluocinolone Acetonide Body 0.01 % Oil derm   hydrocortisone 2.5 % cream Apply 1 Application topically.   ketoconazole 2 % cream Commonly known as: NIZORAL Apply 1 Application  topically daily.   MAGNESIUM PO MAGNESIUM L-THREONATE ORAL   NON FORMULARY Physilium HUSK daily   PSYLLIUM HUSK PO Use 2 (two) times daily Taking 1 pill at noon and 1 at night   tamsulosin 0.4 MG Caps capsule Commonly known as: FLOMAX Take 0.4 mg by mouth daily.   UNABLE TO FIND Take by mouth. SLIPPERY ELM BARK ORAL   Vitamin D3 50 MCG (2000 UT) capsule Take by mouth.        Allergies:  Allergies  Allergen Reactions   Other     Goose berry    Family History: No family history on file.  Social History:  reports that he has quit smoking. His smoking use included cigarettes. He has never used smokeless tobacco. He reports current alcohol use. He reports that he does not use drugs.   Physical Exam: BP 111/65 (BP Location: Left Arm, Patient Position: Sitting, Cuff Size: Normal)   Pulse 78   Ht 5\' 10"  (1.778 m)    Wt 196 lb 9.6 oz (89.2 kg)   SpO2 98%   BMI 28.21 kg/m   Constitutional:  Answers some but not all questions and some not appropriate. HEENT: [place] AT, moist mucus membranes.  Trachea midline, no masses. Neurologic: Grossly intact, no focal deficits, moving all 4 extremities. Psychiatric: Normal mood and affect.   Assessment & Plan:    1. Elevated PSA - He's PSA has increased from 4.18 to 6.58 over the past year.  - Given his age and comorbidities, including dementia, the decision was made to monitor the PSA rather than pursue aggressive diagnostic or treatment measures. The rationale is that the likelihood of localized prostate cancer progressing to a life-threatening stage is low. -Repeat the PSA test in six months to monitor for any significant changes.  2. BPH - He is currently on Flomax for BPH and reports getting up once a night to urinate, with no significant issues.  - Continue the current medication regimen. He was advised that if symptoms worsen, they could consider a trial off Tamsulosin to assess necessity, as the medication can be resumed quickly if needed.  Return in about 6 months (around 03/18/2024) for lab visit with PSA. Office visit in 1 year with a PSA.  I have reviewed the above documentation for accuracy and completeness, and I agree with the above.   Vanna Scotland, MD   [facility] [address] [place], [place] [address] [phone]

## 2023-11-09 DIAGNOSIS — D485 Neoplasm of uncertain behavior of skin: Secondary | ICD-10-CM | POA: Diagnosis not present

## 2023-11-09 DIAGNOSIS — Z85828 Personal history of other malignant neoplasm of skin: Secondary | ICD-10-CM | POA: Diagnosis not present

## 2023-11-09 DIAGNOSIS — Z872 Personal history of diseases of the skin and subcutaneous tissue: Secondary | ICD-10-CM | POA: Diagnosis not present

## 2023-11-09 DIAGNOSIS — Z859 Personal history of malignant neoplasm, unspecified: Secondary | ICD-10-CM | POA: Diagnosis not present

## 2023-11-09 DIAGNOSIS — L218 Other seborrheic dermatitis: Secondary | ICD-10-CM | POA: Diagnosis not present

## 2023-11-09 DIAGNOSIS — L570 Actinic keratosis: Secondary | ICD-10-CM | POA: Diagnosis not present

## 2023-11-09 DIAGNOSIS — D0439 Carcinoma in situ of skin of other parts of face: Secondary | ICD-10-CM | POA: Diagnosis not present

## 2023-11-09 DIAGNOSIS — L578 Other skin changes due to chronic exposure to nonionizing radiation: Secondary | ICD-10-CM | POA: Diagnosis not present

## 2023-11-22 DIAGNOSIS — Z8659 Personal history of other mental and behavioral disorders: Secondary | ICD-10-CM | POA: Diagnosis not present

## 2023-11-22 DIAGNOSIS — F03918 Unspecified dementia, unspecified severity, with other behavioral disturbance: Secondary | ICD-10-CM | POA: Diagnosis not present

## 2023-11-22 DIAGNOSIS — G4752 REM sleep behavior disorder: Secondary | ICD-10-CM | POA: Diagnosis not present

## 2023-11-22 DIAGNOSIS — G3183 Dementia with Lewy bodies: Secondary | ICD-10-CM | POA: Diagnosis not present

## 2023-11-22 DIAGNOSIS — F02A2 Dementia in other diseases classified elsewhere, mild, with psychotic disturbance: Secondary | ICD-10-CM | POA: Diagnosis not present

## 2023-11-22 DIAGNOSIS — G250 Essential tremor: Secondary | ICD-10-CM | POA: Diagnosis not present

## 2023-11-22 DIAGNOSIS — R413 Other amnesia: Secondary | ICD-10-CM | POA: Diagnosis not present

## 2023-11-22 DIAGNOSIS — R441 Visual hallucinations: Secondary | ICD-10-CM | POA: Diagnosis not present

## 2023-11-23 DIAGNOSIS — Z636 Dependent relative needing care at home: Secondary | ICD-10-CM | POA: Diagnosis not present

## 2023-11-23 DIAGNOSIS — F0280 Dementia in other diseases classified elsewhere without behavioral disturbance: Secondary | ICD-10-CM | POA: Diagnosis not present

## 2023-11-23 DIAGNOSIS — Z1331 Encounter for screening for depression: Secondary | ICD-10-CM | POA: Diagnosis not present

## 2023-11-23 DIAGNOSIS — Z79899 Other long term (current) drug therapy: Secondary | ICD-10-CM | POA: Diagnosis not present

## 2023-11-23 DIAGNOSIS — F419 Anxiety disorder, unspecified: Secondary | ICD-10-CM | POA: Diagnosis not present

## 2023-11-23 DIAGNOSIS — G3183 Dementia with Lewy bodies: Secondary | ICD-10-CM | POA: Diagnosis not present

## 2023-12-06 DIAGNOSIS — Z859 Personal history of malignant neoplasm, unspecified: Secondary | ICD-10-CM | POA: Diagnosis not present

## 2023-12-06 DIAGNOSIS — L570 Actinic keratosis: Secondary | ICD-10-CM | POA: Diagnosis not present

## 2023-12-12 NOTE — Therapy (Signed)
 OUTPATIENT PHYSICAL THERAPY NEURO EVALUATION   Patient Name: John Burch MRN: [id number] DOB:[birth year], 73 y.o., male Today's Date: 12/12/2023   PCP: Lorrie Rothman, MD REFERRING PROVIDER: Bufford Carne, MD   END OF SESSION: ***   Past Medical History:  Diagnosis Date   Anxiety    B12 deficiency    BPH (benign prostatic hyperplasia)    History of depression    Lewy body dementia [facility])    Past Surgical History:  Procedure Laterality Date   CYST EXCISION     HERNIA REPAIR     SKIN CANCER EXCISION     Patient Active Problem List   Diagnosis Date Noted   B12 deficiency 03/23/2021   Anxiety 09/21/2020   History of depression 09/18/2020   Memory change 09/18/2020    ONSET DATE: ***  REFERRING DIAG:  R29.6 (ICD-10-CM) - Repeated falls  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  Rationale for Evaluation and Treatment: Rehabilitation  SUBJECTIVE:                                                                                                                                                                                             SUBJECTIVE STATEMENT:  ***  MD note: Referral to PT for visual apraxia and falls ***  ***need to assess stairs ***  Pt accompanied by: significant other, [name] ***  PERTINENT HISTORY: ***PMH: Lewy Body Dementia, depression, anxiety, B12 deficiency, REM sleep disorder, tremor  Per MD note on 11/22/2023: "His wife reports worsening gait difficulty and imbalance, particularly when going up stairs. Recent trip and fall, no injuries. Multiple falls out of bed. Worsening action tremor in bilateral hands."   PAIN:  Are you having pain? {OPRCPAIN:27236}  PRECAUTIONS: {Therapy precautions:24002}  RED FLAGS: {PT Red Flags:29287}   WEIGHT BEARING RESTRICTIONS: {Yes ***/No:24003}  FALLS: Has patient fallen in last 6 months? {fallsyesno:27318}  LIVING ENVIRONMENT: Lives with: {OPRC lives  with:25569::"lives with their family"} Lives in: {Lives in:25570} Stairs: {opstairs:27293} Has following equipment at home: {Assistive devices:23999}  PLOF: {PLOF:24004}  PATIENT GOALS: ***  OBJECTIVE:  Note: Objective measures were completed at Evaluation unless otherwise noted.  DIAGNOSTIC FINDINGS: ***  08/14/2021 MRI BRAIN WO CONTRAST  IMPRESSION:  No evidence of acute intracranial abnormality.  Mild chronic small-vessel ischemic changes within the cerebral white  matter.  Mild-to-moderate generalized cerebral atrophy. Comparatively mild  cerebellar atrophy.  Paranasal sinus disease, as described.   COGNITION: Overall cognitive status: History of cognitive impairments - at baseline and ***   SENSATION: {sensation:27233}  COORDINATION: ***  EDEMA:  {edema:24020}  MUSCLE TONE: {  LE tone:25568}  MUSCLE LENGTH: Hamstrings: Right *** deg; Left *** deg Andy Bannister test: Right *** deg; Left *** deg  DTRs:  {DTR SITE:24025}  POSTURE: {posture:25561}  LOWER EXTREMITY ROM:     {AROM/PROM:27142}  Right Eval Left Eval  Hip flexion    Hip extension    Hip abduction    Hip adduction    Hip internal rotation    Hip external rotation    Knee flexion    Knee extension    Ankle dorsiflexion    Ankle plantarflexion    Ankle inversion    Ankle eversion     (Blank rows = not tested)  LOWER EXTREMITY MMT:    MMT Right Eval Left Eval  Hip flexion    Hip extension    Hip abduction    Hip adduction    Hip internal rotation    Hip external rotation    Knee flexion    Knee extension    Ankle dorsiflexion    Ankle plantarflexion    Ankle inversion    Ankle eversion    (Blank rows = not tested)  BED MOBILITY:  {bed mobility:32615:p}  TRANSFERS: {transfers eval:32620}  RAMP:  {ramp eval:32616}  CURB:  {curb eval:32617}  STAIRS: {stairs eval:32618} GAIT: Findings: {GaitneuroPT:32644::"Distance walked: ***","Comments: ***"}  FUNCTIONAL TESTS:   {Functional tests:24029}  PATIENT SURVEYS:  {rehab surveys:24030}                                                                                                                              TREATMENT DATE: 12/12/2023   ***        PATIENT EDUCATION: Education details: *** Person educated: {Person educated:25204} Education method: {Education Method:25205} Education comprehension: {Education Comprehension:25206}  HOME EXERCISE PROGRAM: ***  GOALS: Goals reviewed with patient? Yes  SHORT TERM GOALS: Target date: 01/23/2024  *** Baseline: Goal status: INITIAL   LONG TERM GOALS: Target date: 03/05/2024   *** Baseline:  Goal status: INITIAL  2.  *** Baseline:  Goal status: INITIAL  3.  *** Baseline:  Goal status: INITIAL  4.  *** Baseline:  Goal status: INITIAL  5.  *** Baseline:  Goal status: INITIAL  6.  *** Baseline:  Goal status: INITIAL  ASSESSMENT:  CLINICAL IMPRESSION: Patient is a 73 y.o. male who was seen today for physical therapy evaluation and treatment for ***.   OBJECTIVE IMPAIRMENTS: {opptimpairments:25111}.   ACTIVITY LIMITATIONS: {activitylimitations:27494}  PARTICIPATION LIMITATIONS: {participationrestrictions:25113}  PERSONAL FACTORS: {Personal factors:25162} are also affecting patient's functional outcome.   REHAB POTENTIAL: {rehabpotential:25112}  CLINICAL DECISION MAKING: {clinical decision making:25114}  EVALUATION COMPLEXITY: {Evaluation complexity:25115}  PLAN:  PT FREQUENCY: 1-2x/week  PT DURATION: 12 weeks  PLANNED INTERVENTIONS: {rehab planned interventions:25118::"97110-Therapeutic exercises","97530- Therapeutic [phone]- Neuromuscular re-education","97535- Self JXBJ","47829- Manual therapy"}  PLAN FOR NEXT SESSION: ***     Denaja Verhoeven, PT, DPT, NCS, CSRS Physical Therapist - [place]  [facility]  4:10 PM 12/12/23

## 2023-12-13 ENCOUNTER — Ambulatory Visit: Attending: Neurology | Admitting: Physical Therapy

## 2023-12-13 ENCOUNTER — Encounter: Payer: Self-pay | Admitting: Physical Therapy

## 2023-12-13 ENCOUNTER — Other Ambulatory Visit: Payer: Self-pay

## 2023-12-13 DIAGNOSIS — R269 Unspecified abnormalities of gait and mobility: Secondary | ICD-10-CM | POA: Insufficient documentation

## 2023-12-13 DIAGNOSIS — R278 Other lack of coordination: Secondary | ICD-10-CM | POA: Diagnosis not present

## 2023-12-13 DIAGNOSIS — R2681 Unsteadiness on feet: Secondary | ICD-10-CM | POA: Diagnosis not present

## 2023-12-13 DIAGNOSIS — R482 Apraxia: Secondary | ICD-10-CM | POA: Insufficient documentation

## 2023-12-14 ENCOUNTER — Ambulatory Visit

## 2023-12-14 ENCOUNTER — Encounter: Payer: Self-pay | Admitting: Physical Therapy

## 2023-12-14 DIAGNOSIS — R278 Other lack of coordination: Secondary | ICD-10-CM | POA: Diagnosis not present

## 2023-12-14 DIAGNOSIS — R269 Unspecified abnormalities of gait and mobility: Secondary | ICD-10-CM

## 2023-12-14 DIAGNOSIS — R2681 Unsteadiness on feet: Secondary | ICD-10-CM | POA: Diagnosis not present

## 2023-12-14 DIAGNOSIS — R482 Apraxia: Secondary | ICD-10-CM

## 2023-12-14 NOTE — Therapy (Signed)
 OUTPATIENT PHYSICAL THERAPY NEURO TREATMENT   Patient Name: John Burch MRN: [id number] DOB:25-Sep-1950, 73 y.o., male Today's Date: 12/14/2023   PCP: Lorrie Rothman, MD REFERRING PROVIDER: Bufford Carne, MD   END OF SESSION:   PT End of Session - 12/14/23 1018     Visit Number 2    Number of Visits 24    Date for PT Re-Evaluation 03/06/24    PT Start Time 1016    PT Stop Time 1100    PT Time Calculation (min) 44 min    Equipment Utilized During Treatment Gait belt    Activity Tolerance Patient tolerated treatment well    Behavior During Therapy WFL for tasks assessed/performed             Past Medical History:  Diagnosis Date   Anxiety    B12 deficiency    BPH (benign prostatic hyperplasia)    History of depression    Lewy body dementia (HCC)    Past Surgical History:  Procedure Laterality Date   CYST EXCISION     HERNIA REPAIR     SKIN CANCER EXCISION     Patient Active Problem List   Diagnosis Date Noted   B12 deficiency 03/23/2021   Anxiety 09/21/2020   History of depression 09/18/2020   Memory change 09/18/2020    ONSET DATE: Lewy body dementia for 5 years with balance and gait decline more prominent in past 6 months  REFERRING DIAG:  R29.6 (ICD-10-CM) - Repeated falls  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 No falls.   Pt accompanied by: significant other, [name]   PERTINENT HISTORY: PMH: Lewy Body Dementia, depression, anxiety, B12 deficiency, REM sleep disorder, tremor, hyperlipidemia, BPH, hx of skin  cancer  Per MD note on 11/22/2023: His wife reports worsening gait difficulty and imbalance, particularly when going up stairs. Recent trip and fall, no injuries. Multiple falls out of bed. Worsening action tremor in bilateral hands.   PAIN:  Are you having pain? No  PRECAUTIONS: Fall  RED FLAGS:None   WEIGHT BEARING RESTRICTIONS: No  FALLS: Has patient fallen in last 6 months? Yes. Number of falls 1  LIVING ENVIRONMENT: Lives  with: lives with their spouse [name]) Lives in: House/apartment Stairs: Yes: External: 4-5 steps; can reach both (coming in from the car port) otherwise stairs on back of house only have 1 railing on L side; 1 level Has following equipment at home: shower chair, Grab bars, and walking/hiking sticks (had their bathroom remodeled for handicap accessibility last year  PLOF: Independent with household mobility without device, Independent with gait, and Independent with transfers  CLOF: needs assistance with ADLs (putting toothpaste on toothbrush, heavier silverware due to tremors, dressing [with pt likely having apraxia because it puts clothing on backwards]) and supervision for showering)   PATIENT GOALS: become self-sustaining with my mobility and being able to change positions without help; improve stair navigation; keep independence as long as possible  OBJECTIVE:  Note: Objective measures were completed at Evaluation unless otherwise noted.  DIAGNOSTIC FINDINGS:   08/14/2021 MRI BRAIN WO CONTRAST  IMPRESSION:  No evidence of acute intracranial abnormality.  Mild chronic small-vessel ischemic changes within the cerebral white  matter.  Mild-to-moderate generalized cerebral atrophy. Comparatively mild  cerebellar atrophy.  Paranasal sinus disease, as described.   COGNITION: Overall cognitive status: Impaired and History of cognitive impairments - at baseline    SENSATION: WFL Light touch: WFL on screen  COORDINATION:  Not formally  assessed  EDEMA:  None noticed  MUSCLE TONE: Not formally assessed  MUSCLE LENGTH: Not formally assessed  DTRs:  Not formally assessed  POSTURE: rounded shoulders, forward head, increased lumbar lordosis, and posterior pelvic tilt  LOWER EXTREMITY ROM:     Active   AROM WFL/WNL in BLEs Right Eval Left Eval  Hip flexion    Hip extension    Hip abduction    Hip adduction    Hip internal rotation    Hip external rotation    Knee flexion    Knee extension    Ankle dorsiflexion    Ankle plantarflexion    Ankle inversion    Ankle eversion     (Blank rows = not tested)  LOWER EXTREMITY MMT:    MMT Right Eval Left Eval  Hip flexion 4+ 4+  Hip extension    Hip abduction    Hip adduction    Hip internal rotation    Hip external rotation    Knee flexion 4+ 4  Knee extension 5 4+  Ankle dorsiflexion 4+ 4+  Ankle plantarflexion 4+ 4+  Ankle inversion    Ankle eversion    (Blank rows = not tested)  Pt with noticeable apraxia during MMT assessment with difficulty following commands for testing  Manual Muscle Test Scale 0/5 = No muscle contraction can be seen or felt 1/5 = Contraction can be felt, but there is no motion 2-/5 = Part moves through incomplete ROM w/ gravity decreased 2/5 = Part moves through complete ROM w/ gravity decreased 2+/5 = Part moves through incomplete ROM (<50%) against gravity or through complete ROM w/ gravity 3-/5 = Part moves through incomplete ROM (>50%) against gravity 3/5 = Part moves through complete ROM against gravity 3+/5 = Part moves through complete ROM against gravity/slight resistance 4-/5= Holds test position against slight to moderate pressure 4/5 = Part moves through complete ROM against gravity/moderate resistance 4+/5= Holds test position against moderate to strong pressure 5/5 = Part moves through complete ROM against gravity/full resistance  BED MOBILITY:  Not tested, but need to assess    TRANSFERS: Sit to  stand: Modified independence  Assistive device utilized: armrests     Stand  to sit: Modified independence  Assistive device utilized: None     Chair to chair: Modified independence and SBA  Assistive device utilized: armrests       RAMP:  Not tested  CURB:  Not tested  STAIRS: Findings: Level of Assistance: Min A, Stair Negotiation Technique: Step to Pattern Alternating Pattern  with No Rails Single Rail on Right, Number of Stairs: 8, Height of Stairs: 6in   , and Comments: reciprocal on ascent without UE support, started with reciprocal on descent with 1 HR and then attempted no HR on descent, but requries increased assist and transition to step-to pattern *Educated pt on recommendation that he use a handrail at all times if available and pt in agreement GAIT: Findings: Gait Characteristics: step through pattern, decreased arm swing- Right, decreased arm swing- Left, decreased step length- Right, decreased step length- Left, and decreased stride length, Distance walked: ~17ft, Assistive device utilized:None, Level of assistance: CGA, and Comments: N/A  FUNCTIONAL TESTS:  5 times sit to stand: 9.91 seconds, no UE support, from green chair 6 minute walk test: need to assess 10 meter walk test: Average Normal speed: 0.70 m/s (14.25 seconds) Mini-BESTest: need to assess Functional gait assessment: need to assess  PATIENT SURVEYS:  ABC scale : need to assess                                                                                                                              TREATMENT DATE: 12/14/2023  Physical Performance:   6 MWT: 1247  MiniBESTest:   FGA:   [facility] PT Assessment - 12/14/23 0001       Standardized Balance Assessment   Standardized Balance Assessment Mini-BESTest      Mini-BESTest   Sit To Stand Normal: Comes to stand without use of hands and stabilizes independently.    Rise to Toes Moderate: Heels up, but not full range (smaller than when holding hands),  OR noticeable instability for 3 s.    Stand on one leg (left) Moderate: < 20 s    Stand on one leg (right) Moderate: < 20 s    Stand on one leg - lowest score 1    Compensatory Stepping Correction - Forward Normal: Recovers independently with a single, large step (second realignement is allowed).    Compensatory Stepping Correction - Backward Normal: Recovers independently with a single, large step    Compensatory Stepping Correction - Left Lateral Normal: Recovers independently with 1 step (crossover or lateral OK)    Compensatory Stepping Correction - Right Lateral Normal: Recovers independently with 1 step (crossover or lateral OK)    Stepping Corredtion Lateral - lowest score 2    Stance - Feet together, eyes open, firm surface  Normal: 30s    Stance - Feet together, eyes closed, foam surface  Moderate: < 30s    Incline - Eyes Closed Severe: Unable    Change in Gait Speed Normal: Significantly changes walkling speed  without imbalance    Walk with head turns - Horizontal Normal: performs head turns with no change in gait speed and good balance    Walk with pivot turns Normal: Turns with feet close FAST (< 3 steps) with good balance.    Step over obstacles Normal: Able to step over box with minimal change of gait speed and with good balance.    Timed UP & GO with Dual Task Severe: Stops counting while walking OR stops walking while counting.    Mini-BEST total score 21      Functional Gait  Assessment   Gait assessed  Yes    Gait Level Surface Walks 20 ft in less than 5.5 sec, no assistive devices, good speed, no evidence for imbalance, normal gait pattern, deviates no more than 6 in outside of the 12 in walkway width.    Change in Gait Speed Able to change speed, demonstrates mild gait deviations, deviates 6-10 in outside of the 12 in walkway width, or no gait deviations, unable to achieve a major change in velocity, or uses a change in velocity, or uses an assistive device.    Gait with  Horizontal Head Turns Performs head turns smoothly with slight change in gait velocity (eg, minor disruption to smooth gait path), deviates 6-10 in outside 12 in walkway width, or uses an assistive device.    Gait with Vertical Head Turns Performs task with slight change in gait velocity (eg, minor disruption to smooth gait path), deviates 6 - 10 in outside 12 in walkway width or uses assistive device    Gait and Pivot Turn Pivot turns safely within 3 sec and stops quickly with no loss of balance.    Step Over Obstacle Is able to step over 2 stacked shoe boxes taped together (9 in total height) without changing gait speed. No evidence of imbalance.    Gait with Narrow Base of Support Ambulates less than 4 steps heel to toe or cannot perform without assistance.    Gait with Eyes Closed Walks 20 ft, slow speed, abnormal gait pattern, evidence for imbalance, deviates 10-15 in outside 12 in walkway width. Requires more than 9 sec to ambulate 20 ft.    Ambulating Backwards Walks 20 ft, slow speed, abnormal gait pattern, evidence for imbalance, deviates 10-15 in outside 12 in walkway width.    Steps Two feet to a stair, must use rail.    Total Score 18               PATIENT EDUCATION: Education details: Findings on assessment, therapy POC, LTGs, and importance of staying active Person educated: Patient and Spouse Education method: Explanation Education comprehension: verbalized understanding and needs further education  HOME EXERCISE PROGRAM: Need to initiate  GOALS: Goals reviewed with patient? Yes  SHORT TERM GOALS: Target date: 01/24/2024  Patient will be independent with home exercise program to improve strength/mobility for better functional independence with ADLs.  Baseline: need to initiate Goal status: INITIAL   LONG TERM GOALS: Target date: 03/06/2024   Patient will increase six minute walk test distance to >1559ft for progression to community level ambulation, demonstrating  improved gait endurance. Baseline: need to assess; 12/14/23: 1,247' Goal status: INITIAL  2.  Patient will increase 10 meter walk test to >1.93m/s as to improve gait speed for improved community ambulation and to reduce fall risk. Baseline: 0.44m/s  Goal status: INITIAL  3.  Patient will increase MiniBest Test score to >18/28 to indicate a reduced risk for  falling and demonstrate increased independence with functional mobility and ADLs.  Baseline: need to assess; 12/14/23: 21/28 Goal status: INITIAL  4.  Patient will increase Functional Gait Assessment (FGA) score to >20/30 as to reduce fall risk and improve dynamic gait safety with community ambulation.  Baseline: need to assess; 12/14/23:18/30 Goal status: INITIAL  5.  Patient will ascend/descend 4 stairs with only 1 rail, independently without loss of balance to improve ability to get in/out of home.   Baseline: requires min A Goal status: INITIAL   ASSESSMENT:  CLINICAL IMPRESSION: Pt arriving with spouse in good spirits. Focus of session on continuing with outcome measure assessment. Pt continues to have difficulties from his dementia following commands thus some clinical reasoning needed for appropriate scoring of outcome measures. Anticipate most of pt's balance issues are related to his cognition (I.e. poor safety awareness). Pt is at increase risk for falls per scoring of FGA. Pt will benefit from further skilled PT to improve these deficits in order to increase QOL, decrease fall risk, and ease/safety with ADLs and functional mobility.   OBJECTIVE IMPAIRMENTS: Abnormal gait, decreased activity tolerance, decreased balance, decreased cognition, decreased endurance, decreased knowledge of condition, decreased knowledge of use of DME, decreased mobility, difficulty walking, decreased strength, and decreased safety awareness.   ACTIVITY LIMITATIONS: carrying, lifting, bending, squatting, stairs, transfers, bed mobility, bathing,  toileting, dressing, reach over head, hygiene/grooming, and locomotion level  PARTICIPATION LIMITATIONS: meal prep, cleaning, laundry, medication management, personal finances, interpersonal relationship, community activity, yard work, and leisure activities of hiking  PERSONAL FACTORS: Age, Fitness, Time since onset of injury/illness/exacerbation, and 3+ comorbidities: Lewy Body Dementia, depression, anxiety, B12 deficiency, REM sleep disorder, tremor, hyperlipidemia, BPH, hx of skin cancer are also affecting patient's functional outcome.   REHAB POTENTIAL: Good  CLINICAL DECISION MAKING: Evolving/moderate complexity  EVALUATION COMPLEXITY: Moderate  PLAN:  PT FREQUENCY: 1-2x/week  PT DURATION: 12 weeks  PLANNED INTERVENTIONS: 97164- PT Re-evaluation, 97750- Physical Performance Testing, 97110-Therapeutic exercises, 97530- Therapeutic activity, [id number]- Neuromuscular re-education, 97535- Self Care, 16109- Manual therapy, [phone]- Gait training, [phone]- Orthotic Initial, [phone]- Orthotic/Prosthetic subsequent, [phone]- Canalith repositioning, Patient/Family education, Balance training, Stair training, Taping, Joint mobilization, Spinal mobilization, Vestibular training, Visual/preceptual remediation/compensation, Cognitive remediation, DME instructions, Cryotherapy, Moist heat, and Biofeedback  PLAN FOR NEXT SESSION:  - follow-up on SLP and OT referrals - Establish HEP   Marc Senior. Fairly IV, PT, DPT Physical Therapist- [place]  [facility] 11:42 AM 12/14/23

## 2023-12-20 ENCOUNTER — Ambulatory Visit: Admitting: Physical Therapy

## 2023-12-20 DIAGNOSIS — R2681 Unsteadiness on feet: Secondary | ICD-10-CM

## 2023-12-20 DIAGNOSIS — R482 Apraxia: Secondary | ICD-10-CM | POA: Diagnosis not present

## 2023-12-20 DIAGNOSIS — R269 Unspecified abnormalities of gait and mobility: Secondary | ICD-10-CM

## 2023-12-20 DIAGNOSIS — R278 Other lack of coordination: Secondary | ICD-10-CM

## 2023-12-20 NOTE — Therapy (Signed)
 OUTPATIENT PHYSICAL THERAPY NEURO TREATMENT   Patient Name: [name] MRN: [id number] DOB:May 07, 1951, 73 y.o., male Today's Date: 12/20/2023   PCP: John Rothman, MD REFERRING PROVIDER: Bufford Carne, MD   END OF SESSION:   PT End of Session - 12/20/23 1402     Visit Number 3    Number of Visits 24    Date for PT Re-Evaluation 03/06/24    PT Start Time 1403    PT Stop Time 1445    PT Time Calculation (min) 42 min    Equipment Utilized During Treatment Gait belt    Activity Tolerance Patient tolerated treatment well    Behavior During Therapy WFL for tasks assessed/performed           Past Medical History:  Diagnosis Date   Anxiety    B12 deficiency    BPH (benign prostatic hyperplasia)    History of depression    Lewy body dementia (HCC)    Past Surgical History:  Procedure Laterality Date   CYST EXCISION     HERNIA REPAIR     SKIN CANCER EXCISION     Patient Active Problem List   Diagnosis Date Noted   B12 deficiency 03/23/2021   Anxiety 09/21/2020   History of depression 09/18/2020   Memory change 09/18/2020    ONSET DATE: Lewy body dementia for 5 years with balance and gait decline more prominent in past 6 months  REFERRING DIAG:  R29.6 (ICD-10-CM) - Repeated falls  R48.2 (ICD-10-CM) - Apraxia    THERAPY DIAG:  Unsteadiness on feet  Abnormality of gait  Apraxia  Other lack of coordination  Rationale for Evaluation and Treatment: Rehabilitation  SUBJECTIVE:                                                                                                                                                                                             SUBJECTIVE STATEMENT:  Spouse states she removed the foot straps on pt's stationary bike and he rode it for 7 miles this morning. Reports pt was able to get on/off of stationary bike safely. Spouse states she did purchase pt 2 new slip-on sneakers at slightly larger size to allow pt to  don/doff shoes independently.   Therapist educated pt and S.O. that pt's referrals for OT and SLP have come through and initial evals are on Thursday, June 19th.   Denies pain. Denies stumbles/falls.    Pt accompanied by: significant other, John Burch   PERTINENT HISTORY: PMH: Lewy Body Dementia, depression, anxiety, B12 deficiency, REM sleep disorder, tremor, hyperlipidemia, BPH, hx of skin cancer  Per MD note  on 11/22/2023: His wife reports worsening gait difficulty and imbalance, particularly when going up stairs. Recent trip and fall, no injuries. Multiple falls out of bed. Worsening action tremor in bilateral hands.   PAIN:  Are you having pain? No  PRECAUTIONS: Fall  RED FLAGS:None   WEIGHT BEARING RESTRICTIONS: No  FALLS: Has patient fallen in last 6 months? Yes. Number of falls 1  LIVING ENVIRONMENT: Lives with: lives with their spouse John Burch) Lives in: House/apartment Stairs: Yes: External: 4-5 steps; can reach both (coming in from the car port) otherwise stairs on back of house only have 1 railing on L side; 1 level Has following equipment at home: shower chair, Grab bars, and walking/hiking sticks (had their bathroom remodeled for handicap accessibility last year  PLOF: Independent with household mobility without device, Independent with gait, and Independent with transfers  CLOF: needs assistance with ADLs (putting toothpaste on toothbrush, heavier silverware due to tremors, dressing [with pt likely having apraxia because it puts clothing on backwards]) and supervision for showering)   PATIENT GOALS: become self-sustaining with my mobility and being able to change positions without help; improve stair navigation; keep independence as long as possible  OBJECTIVE:  Note: Objective measures were completed at Evaluation unless otherwise noted.  DIAGNOSTIC FINDINGS:   08/14/2021 MRI BRAIN WO CONTRAST  IMPRESSION:  No evidence of acute intracranial abnormality.  Mild  chronic small-vessel ischemic changes within the cerebral white  matter.  Mild-to-moderate generalized cerebral atrophy. Comparatively mild  cerebellar atrophy.  Paranasal sinus disease, as described.   COGNITION: Overall cognitive status: Impaired and History of cognitive impairments - at baseline    SENSATION: WFL Light touch: WFL on screen  COORDINATION:  Not formally assessed  EDEMA:  None noticed  MUSCLE TONE: Not formally assessed  MUSCLE LENGTH: Not formally assessed  DTRs:  Not formally assessed  POSTURE: rounded shoulders, forward head, increased lumbar lordosis, and posterior pelvic tilt  LOWER EXTREMITY ROM:     Active   AROM WFL/WNL in BLEs Right Eval Left Eval  Hip flexion    Hip extension    Hip abduction    Hip adduction    Hip internal rotation    Hip external rotation    Knee flexion    Knee extension    Ankle dorsiflexion    Ankle plantarflexion    Ankle inversion    Ankle eversion     (Blank rows = not tested)  LOWER EXTREMITY MMT:    MMT Right Eval Left Eval  Hip flexion 4+ 4+  Hip extension    Hip abduction    Hip adduction    Hip internal rotation    Hip external rotation    Knee flexion 4+ 4  Knee extension 5 4+  Ankle dorsiflexion 4+ 4+  Ankle plantarflexion 4+ 4+  Ankle inversion    Ankle eversion    (Blank rows = not tested)  Pt with noticeable apraxia during MMT assessment with difficulty following commands for testing  Manual Muscle Test Scale 0/5 = No muscle contraction can be seen or felt 1/5 = Contraction can be felt, but there is no motion 2-/5 = Part moves through incomplete ROM w/ gravity decreased 2/5 = Part moves through complete ROM w/ gravity decreased 2+/5 = Part moves through incomplete ROM (<50%) against gravity or through complete ROM w/ gravity 3-/5 = Part moves through incomplete ROM (>50%) against gravity 3/5 = Part moves through complete ROM against gravity 3+/5 = Part moves through complete  ROM against gravity/slight resistance 4-/5= Holds test position against slight to moderate pressure 4/5 = Part moves through complete ROM against gravity/moderate resistance 4+/5= Holds test position against moderate to strong pressure 5/5 = Part moves through complete ROM against gravity/full resistance  BED MOBILITY:  Not tested, but need to assess    TRANSFERS: Sit to stand: Modified independence  Assistive device utilized: armrests     Stand to sit: Modified independence  Assistive device utilized: None     Chair to chair: Modified independence and SBA  Assistive device utilized: armrests       RAMP:  Not tested  CURB:  Not tested  STAIRS: Findings: Level of Assistance: Min A, Stair Negotiation Technique: Step to Pattern Alternating Pattern  with No Rails Single Rail on Right, Number of Stairs: 8, Height of Stairs: 6in   , and Comments: reciprocal on ascent without UE support, started with reciprocal on descent with 1 HR and then attempted no HR on descent, but requries increased assist and transition to step-to pattern *Educated pt on recommendation that he use a handrail at all times if available and pt in agreement GAIT: Findings: Gait Characteristics: step through pattern, decreased arm swing- Right, decreased arm swing- Left, decreased step length- Right, decreased step length- Left, and decreased stride length, Distance walked: ~126ft, Assistive device utilized:None, Level of assistance: CGA, and Comments: N/A  FUNCTIONAL TESTS:  5 times sit to stand: 9.91 seconds, no UE support, from green chair 6 minute walk test: need to assess 10 meter walk test: Average Normal speed: 0.70 m/s (14.25 seconds) Mini-BESTest: need to assess Functional gait assessment: need to assess  PATIENT SURVEYS:  ABC scale : need to assess                                                                                                                              TREATMENT DATE:  12/20/2023  Unless otherwise stated, CGA was provided and gait belt donned in order to ensure pt safety throughout session.  Activities-specific Balance Confidence Scale:  Score: 50.625% - completed by pt's wife to indicate pt's perceived balance impairments by her due to pt's cognitive impairments limiting his understanding of a subjective questionnaire Increased risk of falls in community-dwelling, older adults <80% (79.89%)  0% = no confidence - 100% = complete confidence (ANPTA Core Set of Outcome Measures for Adults with Neurologic Conditions, 2018)  Dynamic gait training ~372ft with progression to head turns to identify targets on wall for added dual-cognitive task to name letters, numbers, and punctuation on post-it notes - pt with moderate difficulty quickly naming the items (greatest difficulty with punctuation marks), but no overt LOB when turning head. CGA provided for safety   Dynamic gait training using agility ladder including the following:  - forward reciprocal pattern 1 lap - side stepping 2 laps - backwards step-to pattern 1 lap  *pt with significant challenge following commands to understand the goal of the intervention  to step inside of the ladder squares despite max multimodal cuing and visual demonstration *pt frequently stepping on ladder reigns instead *CGA/close SBA provided throughout to allow therapist to attempt to provide visual demonstration to help pt understand sequence, no overt LOB  Dynamic stepping balance interventions: Stepping over obstacles 3x 1/2 foam rolls in the following directions Forward reciprocal pattern - 3 laps Side stepping - 3 laps Backwards step-to pattern - 2 laps CGA for steadying throughout with a few moments of min A during backwards   Stepping in 4 directions to external targets using colored arrow Powerpoint presentation with rules as follows:  Green arrow = step same direction as the arrow is pointing  Pt with significant  difficulty executing correct motor plan to step in direction of the arrow despite patient gesturing with his hand in the correct direction Greatest challenge with the downward pointing arrow indicating to step backwards  Throughout session therapist observing and learning patient's responses to different types of cuing/instructions as follows:  Pt doesn't respond well to visual demonstration of therapist doing the task prior to him trying because he cannot keep it in his working memory to recall it, even directly after visual demo provided Has some difficulty with therapist providing simultaneous visual demonstration while he is performing the task because therapist cannot be in the position necessary to demonstrate and guard him at the same time Responds best when therapist breaks down the task into 1-step, simple verbal commands     PATIENT EDUCATION: Education details: Findings on assessment, therapy POC, LTGs, and importance of staying active Person educated: Patient and Spouse Education method: Explanation Education comprehension: verbalized understanding and needs further education  HOME EXERCISE PROGRAM: Need to initiate  GOALS: Goals reviewed with patient? Yes  SHORT TERM GOALS: Target date: 01/24/2024  Patient will be independent with home exercise program to improve strength/mobility for better functional independence with ADLs.  Baseline: need to initiate Goal status: INITIAL   LONG TERM GOALS: Target date: 03/06/2024   Patient will increase six minute walk test distance to >1545ft for progression to community level ambulation, demonstrating improved gait endurance. Baseline:12/14/23: 79,247' Goal status: INITIAL  2.  Patient will increase 10 meter walk test to >1.61m/s as to improve gait speed for improved community ambulation and to reduce fall risk. Baseline: 0.57m/s  Goal status: INITIAL  3.  Patient will increase MiniBest Test score to >18/28 to indicate a reduced  risk for falling and demonstrate increased independence with functional mobility and ADLs.  Baseline: 12/14/23: 21/28 Goal status: INITIAL  4.  Patient will increase Functional Gait Assessment (FGA) score to >20/30 as to reduce fall risk and improve dynamic gait safety with community ambulation.  Baseline:  12/14/23:18/30 Goal status: INITIAL  5.  Patient will ascend/descend 4 stairs with only 1 rail, independently without loss of balance to improve ability to get in/out of home.   Baseline: requires min A Goal status: INITIAL  6. Patient's ABC scale score will increase to >80% to demonstrate better functional mobility and better confidence with ADLs based on pt's wife's perspective due to pt's cognitive impairments limiting his ability to complete a subjective questionnaire.   Baseline: 12/20/2023: 50.625%  Goal status: INITIAL   ASSESSMENT:  CLINICAL IMPRESSION:  Pt arriving with spouse in good spirits. Therapy session focused on variable direction stepping with dual-task component in order to challenge him to perform a sequenced task so therapist can learn his response to various types of cuing/instructions. As noted above, pt responds  best to 1-step simple instructions that break the task down to allow him to motor plan/execute each component, especially with novel tasks. Pt with decreased anticipatory awareness of what may be challenging to his balance with new/unfamiliar tasks, often resulting in him moving more quickly than is safe. Therapist educated pt's S.O. on these findings and she was present to observe throughout session. Pt may benefit from circuit style training increased focus on working at increased intensity performing a more familiar task (such as weighted sit<>stands) followed by a more cognitively demanding dual-task. Pt will benefit from further skilled PT to improve these deficits in order to increase QOL, decrease fall risk, and ease/safety with ADLs and functional mobility.    OBJECTIVE IMPAIRMENTS: Abnormal gait, decreased activity tolerance, decreased balance, decreased cognition, decreased endurance, decreased knowledge of condition, decreased knowledge of use of DME, decreased mobility, difficulty walking, decreased strength, and decreased safety awareness.   ACTIVITY LIMITATIONS: carrying, lifting, bending, squatting, stairs, transfers, bed mobility, bathing, toileting, dressing, reach over head, hygiene/grooming, and locomotion level  PARTICIPATION LIMITATIONS: meal prep, cleaning, laundry, medication management, personal finances, interpersonal relationship, community activity, yard work, and leisure activities of hiking  PERSONAL FACTORS: Age, Fitness, Time since onset of injury/illness/exacerbation, and 3+ comorbidities: Lewy Body Dementia, depression, anxiety, B12 deficiency, REM sleep disorder, tremor, hyperlipidemia, BPH, hx of skin cancer are also affecting patient's functional outcome.   REHAB POTENTIAL: Good  CLINICAL DECISION MAKING: Evolving/moderate complexity  EVALUATION COMPLEXITY: Moderate  PLAN:  PT FREQUENCY: 1-2x/week  PT DURATION: 12 weeks  PLANNED INTERVENTIONS: 97164- PT Re-evaluation, 97750- Physical Performance Testing, 97110-Therapeutic exercises, 97530- Therapeutic activity, 97112- Neuromuscular re-education, 97535- Self Care, 96295- Manual therapy, [phone]- Gait training, [phone]- Orthotic Initial, [phone]- Orthotic/Prosthetic subsequent, [phone]- Canalith repositioning, Patient/Family education, Balance training, Stair training, Taping, Joint mobilization, Spinal mobilization, Vestibular training, Visual/preceptual remediation/compensation, Cognitive remediation, DME instructions, Cryotherapy, Moist heat, and Biofeedback  PLAN FOR NEXT SESSION:  - Establish HEP  - pt uses stationary bike at home - try circuit style training alternating between a familiar strength training task to increase his HR and a cognitively demanding dual-task  intervention    Lucah Petta, PT, DPT, NCS, CSRS Physical Therapist - [facility]  [facility]  3:02 PM 12/20/23

## 2023-12-22 ENCOUNTER — Ambulatory Visit

## 2023-12-22 ENCOUNTER — Ambulatory Visit: Admitting: Occupational Therapy

## 2023-12-22 ENCOUNTER — Ambulatory Visit: Admitting: Physical Therapy

## 2023-12-26 ENCOUNTER — Ambulatory Visit

## 2023-12-26 ENCOUNTER — Ambulatory Visit: Admitting: Physical Therapy

## 2023-12-28 ENCOUNTER — Ambulatory Visit

## 2023-12-28 ENCOUNTER — Ambulatory Visit: Admitting: Physical Therapy

## 2024-01-02 ENCOUNTER — Ambulatory Visit: Admitting: Physical Therapy

## 2024-01-04 ENCOUNTER — Encounter: Admitting: Occupational Therapy

## 2024-01-04 ENCOUNTER — Encounter

## 2024-01-10 ENCOUNTER — Encounter

## 2024-01-10 ENCOUNTER — Ambulatory Visit: Admitting: Physical Therapy

## 2024-01-12 ENCOUNTER — Encounter

## 2024-01-12 ENCOUNTER — Ambulatory Visit

## 2024-01-17 ENCOUNTER — Ambulatory Visit: Admitting: Physical Therapy

## 2024-01-17 ENCOUNTER — Encounter

## 2024-01-19 ENCOUNTER — Encounter

## 2024-01-19 ENCOUNTER — Ambulatory Visit: Admitting: Physical Therapy

## 2024-01-24 ENCOUNTER — Encounter

## 2024-01-26 ENCOUNTER — Encounter

## 2024-01-31 ENCOUNTER — Encounter

## 2024-01-31 ENCOUNTER — Ambulatory Visit: Admitting: Physical Therapy

## 2024-02-02 ENCOUNTER — Ambulatory Visit: Admitting: Physical Therapy

## 2024-02-02 ENCOUNTER — Encounter

## 2024-02-07 ENCOUNTER — Encounter

## 2024-02-07 ENCOUNTER — Ambulatory Visit: Admitting: Physical Therapy

## 2024-02-09 ENCOUNTER — Encounter

## 2024-02-09 ENCOUNTER — Ambulatory Visit

## 2024-02-14 ENCOUNTER — Encounter

## 2024-02-14 ENCOUNTER — Ambulatory Visit: Admitting: Physical Therapy

## 2024-02-16 ENCOUNTER — Ambulatory Visit: Admitting: Physical Therapy

## 2024-02-16 ENCOUNTER — Encounter: Payer: Self-pay | Admitting: Urology

## 2024-02-16 ENCOUNTER — Encounter

## 2024-02-21 ENCOUNTER — Encounter

## 2024-02-21 ENCOUNTER — Ambulatory Visit: Admitting: Physical Therapy

## 2024-02-23 ENCOUNTER — Encounter

## 2024-02-23 ENCOUNTER — Ambulatory Visit: Admitting: Physical Therapy

## 2024-02-27 ENCOUNTER — Other Ambulatory Visit: Payer: Self-pay

## 2024-02-27 DIAGNOSIS — R972 Elevated prostate specific antigen [PSA]: Secondary | ICD-10-CM

## 2024-02-28 ENCOUNTER — Ambulatory Visit: Admitting: Physical Therapy

## 2024-02-28 ENCOUNTER — Encounter

## 2024-02-28 ENCOUNTER — Other Ambulatory Visit

## 2024-02-28 DIAGNOSIS — R972 Elevated prostate specific antigen [PSA]: Secondary | ICD-10-CM

## 2024-02-29 LAB — PSA: Prostate Specific Ag, Serum: 5 ng/mL — ABNORMAL HIGH (ref 0.0–4.0)

## 2024-03-01 ENCOUNTER — Encounter

## 2024-03-01 ENCOUNTER — Ambulatory Visit: Admitting: Physical Therapy

## 2024-03-01 ENCOUNTER — Encounter: Admitting: Occupational Therapy

## 2024-03-01 NOTE — Progress Notes (Signed)
 03/02/2024 8:29 PM   Redell DEL Feldner 1951-01-18 [id number]  Referring provider: Jeffie Cheryl BRAVO, MD [address] [place],  [place] [address]  Urological history: 1.  Elevated PSA - PSA (02/2024) 5.0 - continue to monitor for any significant changes  2. BPH w/ LU TS - PSA (02/2024) 5.0 - Tamsulosin 0.4 mg daily   3. ED - Sildenafil 20 mg, on-demand dosing  Chief Complaint  Patient presents with   Establish Care   Urinary Frequency   HPI: John Burch is a 73 y.o. man with Lewy body dementia and BPH with elevated PSA who presents today for frequent urination, pressure when urinating with his wife, karin.    Previous records reviewed.   History is obtained from his wife.  He has been having nocturia x 4-8 for several months and his wife is exhausted because she has to get up with him in the night.  He drinks about 16 to 24 ounces of water daily and 1 cup of coffee.  Patient denies any modifying or aggravating factors.  Patient denies any recent UTI's, gross hematuria, dysuria or suprapubic/flank pain.  Patient denies any fevers, chills, nausea or vomiting.    Serum creatinine in February was 1.1 with a EGFR of 71.  UA positive for 6-10 WBCs, 11-30 RBCs and moderate bacteria  PVR 7 mL    PMH: Past Medical History:  Diagnosis Date   Anxiety    B12 deficiency    BPH (benign prostatic hyperplasia)    History of depression    Lewy body dementia (HCC)     Surgical History: Past Surgical History:  Procedure Laterality Date   CYST EXCISION     HERNIA REPAIR     SKIN CANCER EXCISION      Home Medications:  Allergies as of 03/02/2024       Reactions   Other    Goose berry        Medication List        Accurate as of March 02, 2024 11:59 PM. If you have any questions, ask your nurse or doctor.          busPIRone 7.5 MG tablet Commonly known as: BUSPAR Take 7.5 mg by mouth 2 (two) times daily.   cefUROXime  500 MG tablet Commonly  known as: CEFTIN  Take 1 tablet (500 mg total) by mouth 2 (two) times daily with a meal.   cyanocobalamin 1000 MCG tablet Commonly known as: VITAMIN B12 Take by mouth daily.   divalproex 125 MG DR tablet Commonly known as: DEPAKOTE Take by mouth. Take 125 mg in the morning and 125 mg in the middle and 250 mg at night   divalproex 250 MG DR tablet Commonly known as: DEPAKOTE Take 250 mg by mouth at bedtime.   donepezil 10 MG tablet Commonly known as: ARICEPT Take 1 tablet by mouth at bedtime.   escitalopram 20 MG tablet Commonly known as: LEXAPRO Take 20 mg by mouth daily.   Fluocinolone Acetonide Body 0.01 % Oil derm   hydrocortisone 2.5 % cream Apply 1 Application topically.   ketoconazole 2 % cream Commonly known as: NIZORAL Apply 1 Application topically daily.   MAGNESIUM PO MAGNESIUM L-THREONATE ORAL   NON FORMULARY Physilium HUSK daily   PSYLLIUM HUSK PO Use 2 (two) times daily Taking 1 pill at noon and 1 at night   tamsulosin 0.4 MG Caps capsule Commonly known as: FLOMAX Take 0.4 mg by mouth daily.   UNABLE TO  FIND Take by mouth. SLIPPERY ELM BARK ORAL   Vitamin D3 50 MCG (2000 UT) capsule Take by mouth.        Allergies:  Allergies  Allergen Reactions   Other     Goose berry    Family History: No family history on file.  Social History: See HPI for pertinent social history  ROS: Pertinent ROS in HPI  Physical Exam: BP (!) 143/84 (BP Location: Left Arm, Patient Position: Sitting, Cuff Size: Normal)   Pulse 64   Ht 5' 11 (1.803 m)   Wt 186 lb (84.4 kg)   SpO2 98%   BMI 25.94 kg/m   Constitutional:  Well nourished. Alert and oriented, No acute distress. HEENT: [employer] AT, moist mucus membranes.  Trachea midline Cardiovascular: No clubbing, cyanosis, or edema. Respiratory: Normal respiratory effort, no increased work of breathing. Neurologic: Grossly intact, no focal deficits, moving all 4 extremities. Psychiatric: Normal mood and  affect.  Laboratory Data: See EPIC and HPI  I have reviewed the labs.   Pertinent Imaging:  03/02/24 08:26  Scan Result 7ml    Assessment & Plan:    1. BPH with LU TS - stable severe symptoms  - no signs of retention - PSA up to date and has reduced in value - UA w/ mild pyuria, microscopic heme and moderate bacteria - PVR < 300 cc  - most bothersome symptoms are nocturia  - encouraged avoiding bladder irritants, fluid restriction before bedtime and timed voiding's - Continue tamsulosin 0.4 mg daily - educated on red flag symptoms: acute retention, gross hematuria, fever, severe pain - advised to call clinic or go to the ED if these occur - return to clinic in one month symptom re-evaluation   2.  Elevated PSA - PSA has decreased from 6.58 to 5.0 - given his age and comorbidities, we are continuing to monitor his PSA for significant changes  3. Nocturia - UA send for culture to rule out an indolent infection that may be contributing to his nocturia - I will start him on Ceftin  500 mg twice daily and will adjust once culture results are back - We reviewed his medications, his fluid intake to see if any of these are contributing to his nocturia, but no changes needed to be made at this time - He will return in 1 month for symptom recheck and repeat on UA   Return in about 1 month (around 04/02/2024) for repeat UA and symptom recheck .  These notes generated with voice recognition software. I apologize for typographical errors.  CLOTILDA HELON RIGGERS  [facility] [address]  [address] [place], [place] [address] [phone]

## 2024-03-02 ENCOUNTER — Ambulatory Visit (INDEPENDENT_AMBULATORY_CARE_PROVIDER_SITE_OTHER): Admitting: Urology

## 2024-03-02 VITALS — BP 143/84 | HR 64 | Ht 71.0 in | Wt 186.0 lb

## 2024-03-02 DIAGNOSIS — R972 Elevated prostate specific antigen [PSA]: Secondary | ICD-10-CM | POA: Diagnosis not present

## 2024-03-02 DIAGNOSIS — R351 Nocturia: Secondary | ICD-10-CM

## 2024-03-02 DIAGNOSIS — R300 Dysuria: Secondary | ICD-10-CM | POA: Diagnosis not present

## 2024-03-02 DIAGNOSIS — N401 Enlarged prostate with lower urinary tract symptoms: Secondary | ICD-10-CM

## 2024-03-02 DIAGNOSIS — R339 Retention of urine, unspecified: Secondary | ICD-10-CM | POA: Diagnosis not present

## 2024-03-02 DIAGNOSIS — R350 Frequency of micturition: Secondary | ICD-10-CM | POA: Diagnosis not present

## 2024-03-02 LAB — MICROSCOPIC EXAMINATION

## 2024-03-02 LAB — URINALYSIS, COMPLETE
Bilirubin, UA: NEGATIVE
Glucose, UA: NEGATIVE
Ketones, UA: NEGATIVE
Leukocytes,UA: NEGATIVE
Nitrite, UA: NEGATIVE
Protein,UA: NEGATIVE
Specific Gravity, UA: 1.02 (ref 1.005–1.030)
Urobilinogen, Ur: 0.2 mg/dL (ref 0.2–1.0)
pH, UA: 7.5 (ref 5.0–7.5)

## 2024-03-02 LAB — BLADDER SCAN AMB NON-IMAGING

## 2024-03-02 MED ORDER — CEFUROXIME AXETIL 500 MG PO TABS: 500.0000 mg | ORAL_TABLET | Freq: Two times a day (BID) | ORAL | 0 refills | Status: DC

## 2024-03-05 ENCOUNTER — Encounter: Payer: Self-pay | Admitting: Urology

## 2024-03-06 ENCOUNTER — Encounter

## 2024-03-06 ENCOUNTER — Ambulatory Visit: Admitting: Physical Therapy

## 2024-03-08 ENCOUNTER — Encounter

## 2024-03-08 ENCOUNTER — Ambulatory Visit: Admitting: Physical Therapy

## 2024-03-09 ENCOUNTER — Ambulatory Visit: Payer: Self-pay | Admitting: Urology

## 2024-03-09 LAB — CULTURE, URINE COMPREHENSIVE

## 2024-03-13 ENCOUNTER — Ambulatory Visit

## 2024-03-13 ENCOUNTER — Encounter

## 2024-03-13 ENCOUNTER — Encounter: Admitting: Occupational Therapy

## 2024-03-13 DIAGNOSIS — R441 Visual hallucinations: Secondary | ICD-10-CM | POA: Diagnosis not present

## 2024-03-13 DIAGNOSIS — R498 Other voice and resonance disorders: Secondary | ICD-10-CM | POA: Diagnosis not present

## 2024-03-13 DIAGNOSIS — G3183 Dementia with Lewy bodies: Secondary | ICD-10-CM | POA: Diagnosis not present

## 2024-03-13 DIAGNOSIS — R258 Other abnormal involuntary movements: Secondary | ICD-10-CM | POA: Diagnosis not present

## 2024-03-13 DIAGNOSIS — R251 Tremor, unspecified: Secondary | ICD-10-CM | POA: Diagnosis not present

## 2024-03-13 DIAGNOSIS — F419 Anxiety disorder, unspecified: Secondary | ICD-10-CM | POA: Diagnosis not present

## 2024-03-13 DIAGNOSIS — R413 Other amnesia: Secondary | ICD-10-CM | POA: Diagnosis not present

## 2024-03-13 DIAGNOSIS — G4752 REM sleep behavior disorder: Secondary | ICD-10-CM | POA: Diagnosis not present

## 2024-03-13 DIAGNOSIS — R351 Nocturia: Secondary | ICD-10-CM | POA: Diagnosis not present

## 2024-03-13 NOTE — Progress Notes (Signed)
 Today the history is gathered from: 0% - patient  100% - patient's wife  RECORDS SUMMARY: No new records.  REFERRING PHYSICIAN: Lane Arthea Locus, MD PRIMARY CARE PHYSICIAN:  Feldpausch, Cheryl Therman Raddle., MD  IMPRESSION/PLAN  John Burch is a 73 y.o. male presenting for evaluation of   LEWY BODY DEMENTIA / DEPRESSION/ ANXIETY / B12 DEFICIENCY/ REM SLEEP DISORDER/ TREMOR/ HYPOPHONIA/ BRADYKINESIA/ STIFFNESS/ IMBALANCE/ HALLUCINATIONS/  -Worsening.  -Patient with worsening short term memory, visual hallucinations and lethargy. Hallucinations are now more prominent in the evening when patient is tired. Worsening hypophonia, word finding difficulty, and bradykinesia. New onset of stiffness and rigidity in bilateral legs. Increase in tremors in bilateral hands and arms. Taking Buspar 7.5 mg BID, Depakote 125 mg in the morning, 250 mg in the afternoon and 250 mg at night, Aricept 10 mg at night, Lexapro 20 mg daily and Seroquel 25 mg at night.  -Memory evaluation from 11/22/2023 was 13/30. Repeat in six months.  -Increase Seroquel to 50 mg at night for visual hallucinations and sleep.  -Continue taking Lexapro 20 mg at night for anxiety and sleep.  -Continue taking Depakote 125 mg in the morning, 250 mg in the afternoon, and 250 mg in the evening for mood.  -Continue taking Aricept 10 mg nightly for memory loss.  -Continue taking Buspar 7.5 mg twice daily for mood.  -Will continue to monitor tremors.    Medications previously tried: Clonazepam (0.5 mg once daily, visual hallucinations worse) Seroquel 50 mg (increased confusion and paranoias)  Follow-up with Dr. Lane in 4-6 months, sooner if needed.  p=4  CHIEF COMPLAINT & HPI  John Burch is a 73 y.o. male presenting for evaluation of: Chief Complaint  Patient presents with  . LEWY BODY DEMENTIA / DEPRESSION / ANXIETY  . B12 DEFICIENCY/ REM SLEEP DISORDER/ TREMOR    LEWY BODY DEMENTIA / DEPRESSION / ANXIETY / B12 DEFICIENCY/  REM SLEEP DISORDER/ TREMOR/ HYPOPHONIA/ BRADYKINESIA/ STIFFNESS/ IMBALANCE/ HALLUCINATIONS/  Patient with worsening short term memory concerns and increase in hallucinations. Hallucinations are now more prominent in the evening when patient is tired. Hallucinations are visual. Wife states that he sees people, animals and small things that he tries to pick up. Hallucinations are not distressing. Some occasional delusions, have decreased in frequency. Wife noted worsening urinary incontinence last week due to UTI. Symptoms have improved since finishing antibiotic. Continues to experince some incontinence. Patient with difficulty recognizing names and faces, misplacing items. Worsening hypophonia, word finding difficulty, and bradykinesia. New onset of stiffness and rigidity in bilateral legs. No dysphagia or drooling. Increase in tremors in bilateral hands and arms. Tremors worsen with movement, action and with stress. Worse in the evening as well. Balance is poor. Patient visualizes items on the floor which makes it difficult to ambulate. Difficulty with going down the stairs as well. Does not ambulate with any assisted walking device. No recent falls. Sleep is ok. Wakes up at night due to nocturia. Gets around 8 hours of sleep. Has started to wander at night. Increase in lethargy during the day. Mood has been ok. Have recently hired home aids to help with tasks at home. Wife reports that they help to keep him entertained. Sometimes becomes impatient. Memory evaluation from 11/22/2023 was 13/30. Taking Buspar 7.5 mg BID, Depakote 125 mg in the morning, 250 mg in the afternoon and 250 mg at night, Aricept 10 mg at night, Lexapro 20 mg daily and Seroquel 25 mg at night.    DATA SUMMARY: 01/01/2022 CT  MAXILLOFACIAL WO CONTRAST IMPRESSION:  Chronic left maxillary sinusitis, described above.   08/14/2021 MRI BRAIN WO CONTRAST  IMPRESSION:  No evidence of acute intracranial abnormality.  Mild chronic  small-vessel ischemic changes within the cerebral white  matter.  Mild-to-moderate generalized cerebral atrophy. Comparatively mild  cerebellar atrophy.  Paranasal sinus disease, as described.   VISIT SUMMARIES: 03/15/23: Patient with new and distressing hallucinations. Reports men trying to kill him and strangers in his home. Worsening aggressive and hostile behavior. 2 recent episodes of seizure-like activity occurring in September 2024 with shaking, fever, confusion, and staring spell lasting for around 2 hours. His wife gave him Tylenol for Arthritis to decrease fever. Worsening gait and slowed movement. Per his wife, his speaking has become quieter and slower. Ongoing tremors, worse at night. Sleep is good.Taking Clonazepam 1/2 a tablet of 0.50 mg daily, Aricept 10 mg nightly, Buspar 7.5 mg twice daily,  Lexapro 20 mg nightly, Depakote 250 mg in the morning. His wife notes that he drinks 2 beers daily, inquires about interactions with medications.  07/27/21: Patient with memory loss, worse. Discontinue clonazepam due to side effects. Start buspar 7.5 mg once daily in the morning for a week, then increase to 7.5 mg twice daily for anxiety. Start aricept 5 mg nightly to slow the progression of memory loss. Continue lexapro 20 mg nightly, refilled. Will preauthorize and order brain MRI without contrast to evaluate for structural abnormalities contributing to memory loss progressing at fast rate.    03/23/21: Symptoms, improving. Increase lexapro to 20 mg nightly. Continue clonazepam 0.25 mg in the morning.   11/06/2020: Improving.Patient with memory changes and anxiety. He and wife no longer drink alcohol, have noticed improvement in relationship. Scheduled for cataract surgery, looking forward to this as his vision difficulty has interfered with activities he enjoys Continue lexapro 10 mg daily, refilled.  09/18/2020: Patient with memory changes and depression, new to me. Start taking Lexapro 10 mg  daily for anxiety and depression.   MEDICATIONS Current Outpatient Medications  Medication Sig Dispense Refill  . busPIRone (BUSPAR) 7.5 MG tablet Take 1 tablet (7.5 mg total) by mouth 2 (two) times daily 180 tablet 1  . cholecalciferol (VITAMIN D3) 2,000 unit capsule Take 2,000 Units by mouth once daily    . cyanocobalamin (VITAMIN B12) 1000 MCG tablet Take 1,000 mcg by mouth once daily    . divalproex (DEPAKOTE) 125 MG DR tablet Take 1 tablet (125 mg total) by mouth every morning 90 tablet 1  . divalproex (DEPAKOTE) 250 MG DR tablet Take 1 tablet (250 mg total) by mouth 2 (two) times daily 180 tablet 1  . donepeziL (ARICEPT) 10 MG tablet Take 1 tablet (10 mg total) by mouth at bedtime 90 tablet 1  . escitalopram oxalate (LEXAPRO) 20 MG tablet Take 1 tablet (20 mg total) by mouth once daily 90 tablet 1  . fluconazole (DIFLUCAN) 200 MG tablet Take 200 mg by mouth once a week    . fluocinolone (DERMA-SMOOTHE) 0.01 % external oil derm    . MAGNESIUM L-THREONATE ORAL once daily    . QUEtiapine (SEROQUEL) 25 MG tablet Take 2 tablets (50 mg total) by mouth at bedtime 180 tablet 1  . SLIPPERY ELM BARK ORAL Take by mouth once daily    . tamsulosin (FLOMAX) 0.4 mg capsule TAKE 1 CAPSULE BY MOUTH ONCE DAILY. TAKE 30 MINUTES AFTER SAME MEAL EACH DAY 90 capsule 3  . clonazePAM (KLONOPIN) 0.5 MG tablet Take 1/2 tab nightly (Patient not taking:  Reported on 03/13/2024) 15 tablet 1  . multivitamin-lutein (CENTRUM SILVER) tablet Take 1 tablet by mouth once daily (Patient not taking: Reported on 03/13/2024)    . PSYLLIUM HUSK, BULK, MISC Use 2 (two) times daily Taking 1 pill at noon and 1 at night (Patient not taking: Reported on 03/13/2024)     No current facility-administered medications for this visit.    ALLERGIES Allergies  Allergen Reactions  . Other Anaphylaxis    Goose berry     EXAM   Vitals:   03/13/24 0832  BP: 106/60  Weight: 85.7 kg (189 lb)  Height: 177.8 cm (5' 10)  PainSc: 0-No pain      Body mass index is 27.12 kg/m.  MEMORY EVALUATION: 11/22/2023 - 13/30 08/03/2023 - 17/30 09/21/2022 - 18/30 01/18/2022 - 26/30  07/27/2021 - 20/30 02/09/2021 - 24/30 09/18/2020 - 27/30  GENERAL: Very pleasant male, NAD. Normocephalic and atraumatic. Gait - steady and normal for age; normal gait with no decreased arm swing or shuffling; no tremors with gait. Patient able to touch left thumb to right ear when asked.    PAST MEDICAL HISTORY Past Medical History:  Diagnosis Date  . Cataracts, bilateral   . Elevated PSA   . Erectile dysfunction   . GERD (gastroesophageal reflux disease)   . Lewy body dementia (CMS/HHS-HCC)   . Skin cancer     PAST SURGICAL HISTORY Past Surgical History:  Procedure Laterality Date  . EXTRACTION CATARACT EXTRACAPSULAR W/INSERTION INTRAOCULAR PROSTHESIS Left 01/07/2021   Procedure: Left - LenSx - EXTRACTION CATARACT EXTRACAPSULAR WITH PHACO WITH INSERTION INTRAOCULAR PROSTHESIS;  Surgeon: Thurman Therisa Cohn, MD;  Location: [facility];  Service: Ophthalmology;  Laterality: Left;  . EXTRACTION CATARACT EXTRACAPSULAR W/INSERTION INTRAOCULAR PROSTHESIS Right 01/28/2021   Procedure: Right - Lensx - EXTRACTION CATARACT EXTRACAPSULAR WITH PHACO WITH INSERTION INTRAOCULAR PROSTHESIS;  Surgeon: Thurman Therisa Cohn, MD;  Location: [facility];  Service: Ophthalmology;  Laterality: Right;  . APPENDECTOMY    . HERNIA REPAIR  1962  . Skin cancer removal      FAMILY HISTORY Family History  Problem Relation Name Age of Onset  . No Known Problems Mother    . Obesity Sister Jodi   . Anesthesia problems Neg Hx      SOCIAL HISTORY  Social History   Tobacco Use  . Smoking status: Former    Types: Cigarettes  . Smokeless tobacco: Never  Vaping Use  . Vaping status: Never Used  Substance Use Topics  . Alcohol use: Not Currently    Comment: 1-2 bottles of beer a few times weekly.  . Drug use: No     REVIEW OF SYSTEMS:  13 system ROS  form was given to the patient to complete and I have reviewed it.  The form was sent for scan to the patient's EHR.  Pertinent positives and negatives are mentioned above in the HPI and all other systems are negative.   DATA  I have personally reviewed all of the data outlined below both prior to the appointment and during the appointment with the patient as appropriate.  No visits with results within 6 Month(s) from this visit.  Latest known visit with results is:  Office Visit on 08/22/2023  Component Date Value Ref Range Status  . WBC (White Blood Cell Count) 08/22/2023 5.3  4.1 - 10.2 10^3/uL Final  . RBC (Red Blood Cell Count) 08/22/2023 4.47 (L)  4.69 - 6.13 10^6/uL Final  . Hemoglobin 08/22/2023 14.6  14.1 - 18.1 gm/dL  Final  . Hematocrit 08/22/2023 42.6  40.0 - 52.0 % Final  . MCV (Mean Corpuscular Volume) 08/22/2023 95.3  80.0 - 100.0 fl Final  . MCH (Mean Corpuscular Hemoglobin) 08/22/2023 32.7 (H)  27.0 - 31.2 pg Final  . MCHC (Mean Corpuscular Hemoglobin * 08/22/2023 34.3  32.0 - 36.0 gm/dL Final  . Platelet Count 08/22/2023 150  150 - 450 10^3/uL Final  . RDW-CV (Red Cell Distribution Widt* 08/22/2023 12.5  11.6 - 14.8 % Final  . MPV (Mean Platelet Volume) 08/22/2023 9.4  9.4 - 12.4 fl Final  . Neutrophils 08/22/2023 2.70  1.50 - 7.80 10^3/uL Final  . Lymphocytes 08/22/2023 2.00  1.00 - 3.60 10^3/uL Final  . Mixed Count 08/22/2023 0.60  0.10 - 0.90 10^3/uL Final  . Neutrophil % 08/22/2023 51.8  32.0 - 70.0 % Final  . Lymphocyte % 08/22/2023 37.0  10.0 - 50.0 % Final  . Mixed % 08/22/2023 11.2  3.0 - 14.4 % Final  . Glucose 08/22/2023 85  70 - 110 mg/dL Final  . Sodium 97/82/7974 141  136 - 145 mmol/L Final  . Potassium 08/22/2023 3.9  3.6 - 5.1 mmol/L Final  . Chloride 08/22/2023 103  97 - 109 mmol/L Final  . Carbon Dioxide (CO2) 08/22/2023 30.3  22.0 - 32.0 mmol/L Final  . Urea Nitrogen (BUN) 08/22/2023 18  7 - 25 mg/dL Final  . Creatinine 97/82/7974 1.1  0.7 - 1.3 mg/dL  Final  . Glomerular Filtration Rate (eGFR) 08/22/2023 71  >60 mL/min/1.73sq m Final  . Calcium 08/22/2023 9.1  8.7 - 10.3 mg/dL Final  . AST  97/82/7974 21  8 - 39 U/L Final  . ALT  08/22/2023 17  6 - 57 U/L Final  . Alk Phos (alkaline Phosphatase) 08/22/2023 57  34 - 104 U/L Final  . Albumin 08/22/2023 4.1  3.5 - 4.8 g/dL Final  . Bilirubin, Total 08/22/2023 0.9  0.3 - 1.2 mg/dL Final  . Protein, Total 08/22/2023 6.8  6.1 - 7.9 g/dL Final  . A/G Ratio 97/82/7974 1.5  1.0 - 5.0 gm/dL Final  . Cholesterol, Total 08/22/2023 211 (H)  100 - 200 mg/dL Final  . Triglyceride 97/82/7974 80  35 - 199 mg/dL Final  . HDL (High Density Lipoprotein) Cho* 08/22/2023 59.4  29.0 - 71.0 mg/dL Final  . LDL Calculated 08/22/2023 863 (H)  0 - 130 mg/dL Final  . VLDL Cholesterol 08/22/2023 16  mg/dL Final  . Cholesterol/HDL Ratio 08/22/2023 3.6   Final  . PSA (Prostate Specific Antigen), T* 08/22/2023 6.58 (H)  0.10 - 4.00 ng/mL Final  . Thyroid  Stimulating Hormone (TSH) 08/22/2023 3.275  0.450-5.330 uIU/ml uIU/mL Final      No follow-ups on file.  Payor: [employer]ARE ADVANTAGE PLANS / Plan: [employer] HMO GOLD PLUS / Product Type: HMO /   This note is partially written by Roe Mater, in the presence of and acting as the scribe of Dr. Arthea Farrow.    I have reviewed, edited and added to the note as needed to reflect my best personal medical judgment.    Dr. Arthea Farrow, MD [facility] [place], [place] Ph:  [phone] Fax:  [phone]

## 2024-04-02 NOTE — Progress Notes (Unsigned)
 04/03/2024 1:03 PM   Redell DEL Mellor Apr 15, 1951 [id number]  Referring provider: Jeffie Cheryl BRAVO, MD [address] [place],  [place] [address]  Urological history: 1.  Elevated PSA - PSA (02/2024) 5.0 - continue to monitor for any significant changes  2. BPH w/ LU TS - PSA (02/2024) 5.0 - Tamsulosin 0.4 mg daily   3. ED - Sildenafil 20 mg, on-demand dosing  No chief complaint on file.  HPI: John Burch is a 73 y.o. man with Lewy body dementia and BPH with elevated PSA who presents today for one month follow up after being seen for frequent urination, pressure when urinating and started on Ceftin  with a low colony count culture with his wife, John Burch.    Previous records reviewed.   History is obtained from his wife.  At his visit with me on August 29, he was having nocturia x 4-8 for several months and his wife was exhausted because she was getting up with him in the night.  He was drinking up to 24 ounces of water daily along with 1 cup of coffee.  He has been having nocturia x 4-8 for several months and his wife is exhausted because she has to get up with him in the night.  He drinks about 16 to 24 ounces of water daily and 1 cup of coffee.  Serum creatinine in February was 1.1 with a EGFR of 71.  UA positive for 6-10 WBCs, 11-30 RBCs and moderate bacteria.  Staphylococcus hominis 100 colony count.  PVR 7 mL.  He was prescribed Ceftin  empirically and his medications were reviewed to see if any of these are contributing to his nocturia, but no changes were needed at that time.  I PSS ***  He reports sensation of incomplete bladder emptying, urinary frequency, urinary intermittency, urinary urgency, a weak urinary stream, having to strain to void, nocturia x ***, leaking before being able to reach the restroom, leaking with coughing, leaking without awareness, and post void dribbling.     He is wearing *** pads//depends  daily.    Patient denies any modifying or  aggravating factors.  Patient denies any recent UTI's, gross hematuria, dysuria or suprapubic/flank pain.  Patient denies any fevers, chills, nausea or vomiting.  ***  He has a family history of PCa, colon cancer, ovarian cancer and/or breast cancer with ***.   He does not have a family history of PCa, colon cancer, ovarian cancer, and/or breast cancer .***     UA***  PVR***  PSA (02/2024) 5.0  Serum creatinine (08/2023) 1.1, eGFR 71  Diuretics:  ***  Fluid consumptiom: ***  PMH: Past Medical History:  Diagnosis Date   Anxiety    B12 deficiency    BPH (benign prostatic hyperplasia)    History of depression    Lewy body dementia (HCC)     Surgical History: Past Surgical History:  Procedure Laterality Date   CYST EXCISION     HERNIA REPAIR     SKIN CANCER EXCISION      Home Medications:  Allergies as of 04/03/2024       Reactions   Other    Goose berry        Medication List        Accurate as of April 02, 2024  1:03 PM. If you have any questions, ask your nurse or doctor.          busPIRone 7.5 MG tablet Commonly known as: BUSPAR Take 7.5 mg  by mouth 2 (two) times daily.   cefUROXime  500 MG tablet Commonly known as: CEFTIN  Take 1 tablet (500 mg total) by mouth 2 (two) times daily with a meal.   cyanocobalamin 1000 MCG tablet Commonly known as: VITAMIN B12 Take by mouth daily.   divalproex 125 MG DR tablet Commonly known as: DEPAKOTE Take by mouth. Take 125 mg in the morning and 125 mg in the middle and 250 mg at night   divalproex 250 MG DR tablet Commonly known as: DEPAKOTE Take 250 mg by mouth at bedtime.   donepezil 10 MG tablet Commonly known as: ARICEPT Take 1 tablet by mouth at bedtime.   escitalopram 20 MG tablet Commonly known as: LEXAPRO Take 20 mg by mouth daily.   Fluocinolone Acetonide Body 0.01 % Oil derm   hydrocortisone 2.5 % cream Apply 1 Application topically.   ketoconazole 2 % cream Commonly known as:  NIZORAL Apply 1 Application topically daily.   MAGNESIUM PO MAGNESIUM L-THREONATE ORAL   NON FORMULARY Physilium HUSK daily   PSYLLIUM HUSK PO Use 2 (two) times daily Taking 1 pill at noon and 1 at night   tamsulosin 0.4 MG Caps capsule Commonly known as: FLOMAX Take 0.4 mg by mouth daily.   UNABLE TO FIND Take by mouth. SLIPPERY ELM BARK ORAL   Vitamin D3 50 MCG (2000 UT) capsule Take by mouth.        Allergies:  Allergies  Allergen Reactions   Other     Goose berry    Family History: No family history on file.  Social History: See HPI for pertinent social history  ROS: Pertinent ROS in HPI  Physical Exam: There were no vitals taken for this visit.  Constitutional:  Well nourished. Alert and oriented, No acute distress. HEENT: [place] AT, moist mucus membranes.  Trachea midline, no masses. Cardiovascular: No clubbing, cyanosis, or edema. Respiratory: Normal respiratory effort, no increased work of breathing. GI: Abdomen is soft, non tender, non distended, no abdominal masses. Liver and spleen not palpable.  No hernias appreciated.  Stool sample for occult testing is not indicated.   GU: No CVA tenderness.  No bladder fullness or masses.  Patient with circumcised/uncircumcised phallus. ***Foreskin easily retracted***  Urethral meatus is patent.  No penile discharge. No penile lesions or rashes. Scrotum without lesions, cysts, rashes and/or edema.  Testicles are located scrotally bilaterally. No masses are appreciated in the testicles. Left and right epididymis are normal. Rectal: Patient with  normal sphincter tone. Anus and perineum without scarring or rashes. No rectal masses are appreciated. Prostate is approximately *** grams, *** nodules are appreciated. Seminal vesicles are normal. Skin: No rashes, bruises or suspicious lesions. Lymph: No cervical or inguinal adenopathy. Neurologic: Grossly intact, no focal deficits, moving all 4 extremities. Psychiatric: Normal  mood and affect.   Laboratory Data: See EPIC and HPI  I have reviewed the labs.   Pertinent Imaging: N/A   Assessment & Plan:    1. BPH with LU TS - stable, improving, worsening mild, moderate severe symptoms *** - no signs of retention, infection or malignancy *** - PSA up to date *** - DRE benign *** - UA benign *** - PVR < 300 cc *** - most bothersome symptoms are *** - encouraged avoiding bladder irritants, fluid restriction before bedtime and timed voiding's - Initiate alpha-blocker (***), discussed side effects *** - Initiate 5 alpha reductase inhibitor (***), discussed side effects *** - Continue tamsulosin 0.4 mg daily, alfuzosin 10 mg daily, Rapaflo  8 mg daily, terazosin, doxazosin, Cialis 5 mg daily and finasteride 5 mg daily, dutasteride 0.5 mg daily***:refills given - Cannot tolerate medication or medication failure, schedule cystoscopy *** - educated on red flag symptoms: acute retention, gross hematuria, fever, severe pain - advised to call clinic or go to the ED if these occur - return to clinic in *** symptom re-evaluation ***  2.  Elevated PSA - PSA has decreased from 6.58 to 5.0 - given his age and comorbidities, we are continuing to monitor his PSA for significant changes  3. Nocturia  - I explained to the patient that nocturia is often multi-factorial and difficult to treat.  Sleeping disorders, heart conditions, peripheral vascular disease, diabetes, an enlarged prostate for men, an urethral stricture causing bladder outlet obstruction and/or certain medications can contribute to nocturia.  - I have suggested that the patient avoid caffeine after noon and alcohol in the evening.  He or she may also benefit from fluid restrictions after 6:00 in the evening and voiding just prior to bedtime.  - I have explained that research studies have showed that over 84% of patients with sleep apnea reported frequent nighttime urination.   With sleep apnea, oxygen decreases,  carbon dioxide increases, the blood become more acidic, the heart rate drops and blood vessels in the lung constrict.  The body is then alerted that something is very wrong. The sleeper must wake enough to reopen the airway. By this time, the heart is racing and experiences a false signal of fluid overload. The heart excretes a hormone-like protein that tells the body to get rid of sodium and water, resulting in nocturia.  -  I also informed the patient that a recent study noted that decreasing sodium intake to 2.3 grams daily, if they don't have issues with hyponatremia, can also reduce the number of nightly voids  - There is also an increased incidence in sleep apnea with menopause, symptoms include night sweats, daytime sleepiness, depressed mood, and cognitive complaints like poor concentration or problems with short-term memory ***  - The patient may benefit from a discussion with his or her primary care physician to see if he or she has risk factors for sleep apnea or other sleep disturbances and obtaining a sleep study.  Restricting fluids in the evening (especially caffeinated beverages). Taking diuretic medication in the morning or at least six hours before bedtime. Taking afternoon naps. Naps allow your bloodstream to absorb liquid, meaning you'll need to use the bathroom after a nap. This could reduce your trips to the bathroom at night. Elevating your legs while you're sitting at home. This helps with fluid distribution. Pelvic floor physical therapy to strengthen your pelvic floor muscles. Wearing compression stockings, which also helps with fluid distribution.   No follow-ups on file.  These notes generated with voice recognition software. I apologize for typographical errors.  CLOTILDA HELON RIGGERS  [facility] [address]  [address] [place], [place] [address] [phone]

## 2024-04-03 ENCOUNTER — Ambulatory Visit: Admitting: Urology

## 2024-04-03 ENCOUNTER — Encounter: Payer: Self-pay | Admitting: Urology

## 2024-04-03 VITALS — BP 113/70 | HR 55 | Ht 70.0 in | Wt 186.0 lb

## 2024-04-03 DIAGNOSIS — R3129 Other microscopic hematuria: Secondary | ICD-10-CM | POA: Diagnosis not present

## 2024-04-03 DIAGNOSIS — R972 Elevated prostate specific antigen [PSA]: Secondary | ICD-10-CM

## 2024-04-03 DIAGNOSIS — R351 Nocturia: Secondary | ICD-10-CM | POA: Diagnosis not present

## 2024-04-03 DIAGNOSIS — N138 Other obstructive and reflux uropathy: Secondary | ICD-10-CM | POA: Diagnosis not present

## 2024-04-03 DIAGNOSIS — N401 Enlarged prostate with lower urinary tract symptoms: Secondary | ICD-10-CM | POA: Diagnosis not present

## 2024-04-03 LAB — URINALYSIS, COMPLETE
Bilirubin, UA: NEGATIVE
Glucose, UA: NEGATIVE
Nitrite, UA: NEGATIVE
Specific Gravity, UA: 1.025 (ref 1.005–1.030)
Urobilinogen, Ur: 1 mg/dL (ref 0.2–1.0)
pH, UA: 6 (ref 5.0–7.5)

## 2024-04-03 LAB — MICROSCOPIC EXAMINATION: WBC, UA: >30 /HPF — AB (ref 0–5)

## 2024-05-07 NOTE — Progress Notes (Unsigned)
 05/08/2024 8:41 PM   John Burch [birth year] [id number]  Referring provider: Jeffie Cheryl BRAVO, MD [address] [place],  [place] [address]  Urological history: 1.  Elevated PSA - PSA (02/2024) 5.0 - continue to monitor for any significant changes  2. BPH w/ LU TS - PSA (02/2024) 5.0 - Tamsulosin 0.4 mg daily   3. ED - Sildenafil 20 mg, on-demand dosing  No chief complaint on file.  HPI: John Burch is a 73 y.o. man who presents today for hematuria with his wife, John Burch.    Previous records reviewed.   History is obtained from his wife.  UA ***  PMH: Past Medical History:  Diagnosis Date   Anxiety    B12 deficiency    BPH (benign prostatic hyperplasia)    History of depression    Lewy body dementia (HCC)     Surgical History: Past Surgical History:  Procedure Laterality Date   CYST EXCISION     HERNIA REPAIR     SKIN CANCER EXCISION      Home Medications:  Allergies as of 05/08/2024       Reactions   Other    Goose berry        Medication List        Accurate as of May 07, 2024  8:41 PM. If you have any questions, ask your nurse or doctor.          busPIRone 7.5 MG tablet Commonly known as: BUSPAR Take 7.5 mg by mouth 2 (two) times daily.   cyanocobalamin 1000 MCG tablet Commonly known as: VITAMIN B12 Take by mouth daily.   divalproex 125 MG DR tablet Commonly known as: DEPAKOTE Take by mouth. Take 125 mg in the morning and 125 mg in the middle and 250 mg at night   divalproex 250 MG DR tablet Commonly known as: DEPAKOTE Take 250 mg by mouth at bedtime.   donepezil 10 MG tablet Commonly known as: ARICEPT Take 1 tablet by mouth at bedtime.   escitalopram 20 MG tablet Commonly known as: LEXAPRO Take 20 mg by mouth daily.   fluconazole 200 MG tablet Commonly known as: DIFLUCAN Take 200 mg by mouth once a week.   Fluocinolone Acetonide Body 0.01 % Oil derm   hydrocortisone 2.5 % cream Apply 1  Application topically.   ketoconazole 2 % cream Commonly known as: NIZORAL Apply 1 Application topically daily.   LUTEIN PO Take 1 tablet by mouth daily.   MAGNESIUM PO MAGNESIUM L-THREONATE ORAL   NON FORMULARY Physilium HUSK daily   PSYLLIUM HUSK PO Use 2 (two) times daily Taking 1 pill at noon and 1 at night   QUEtiapine 25 MG tablet Commonly known as: SEROQUEL Take 50 mg by mouth.   tamsulosin 0.4 MG Caps capsule Commonly known as: FLOMAX Take 0.4 mg by mouth daily.   UNABLE TO FIND Take by mouth. SLIPPERY ELM BARK ORAL   Vitamin D3 50 MCG (2000 UT) capsule Take by mouth.        Allergies:  Allergies  Allergen Reactions   Other     Goose berry    Family History: No family history on file.  Social History: See HPI for pertinent social history  ROS: Pertinent ROS in HPI  Physical Exam: There were no vitals taken for this visit.  Constitutional:  Well nourished. Alert and oriented, No acute distress. HEENT: [place] AT, moist mucus membranes.  Trachea midline, no masses. Cardiovascular: No clubbing, cyanosis,  or edema. Respiratory: Normal respiratory effort, no increased work of breathing. GI: Abdomen is soft, non tender, non distended, no abdominal masses. Liver and spleen not palpable.  No hernias appreciated.  Stool sample for occult testing is not indicated.   GU: No CVA tenderness.  No bladder fullness or masses.  Patient with circumcised/uncircumcised phallus. ***Foreskin easily retracted***  Urethral meatus is patent.  No penile discharge. No penile lesions or rashes. Scrotum without lesions, cysts, rashes and/or edema.  Testicles are located scrotally bilaterally. No masses are appreciated in the testicles. Left and right epididymis are normal. Rectal: Patient with  normal sphincter tone. Anus and perineum without scarring or rashes. No rectal masses are appreciated. Prostate is approximately *** grams, *** nodules are appreciated. Seminal vesicles are  normal. Skin: No rashes, bruises or suspicious lesions. Lymph: No cervical or inguinal adenopathy. Neurologic: Grossly intact, no focal deficits, moving all 4 extremities. Psychiatric: Normal mood and affect.   Laboratory Data: See EPIC and HPI  I have reviewed the labs.   Pertinent Imaging: N/A   Assessment & Plan:    1. Gross hematuria - UA *** - urine culture  No follow-ups on file.  These notes generated with voice recognition software. I apologize for typographical errors.  CLOTILDA HELON RIGGERS  [facility] [address]  [address] [place], [place] [address] [phone]

## 2024-05-08 ENCOUNTER — Encounter: Payer: Self-pay | Admitting: Urology

## 2024-05-08 ENCOUNTER — Ambulatory Visit: Admitting: Urology

## 2024-05-08 VITALS — BP 86/45 | HR 71 | Ht 70.0 in | Wt 178.0 lb

## 2024-05-08 DIAGNOSIS — I9589 Other hypotension: Secondary | ICD-10-CM

## 2024-05-08 DIAGNOSIS — R310 Gross hematuria: Secondary | ICD-10-CM

## 2024-05-08 NOTE — Patient Instructions (Signed)
 We have ordered CT with contrast and the scheduling department should reach out to you to schedule this appointment.  Sometimes, they have difficulty reaching patients because they are calling from an unknown number and many people have their phones set to ignore unknown numbers.  They do try to leave messages, but sometimes voicemails are not set up or mailboxes are full.  If you have not heard from the scheduling department in a week, please call [phone] to schedule your CT with contrast.

## 2024-05-09 LAB — MICROSCOPIC EXAMINATION
Bacteria, UA: NONE SEEN
RBC, Urine: >30 /HPF — AB (ref 0–2)

## 2024-05-09 LAB — URINALYSIS, COMPLETE
Bilirubin, UA: NEGATIVE
Glucose, UA: NEGATIVE
Leukocytes,UA: NEGATIVE
Nitrite, UA: NEGATIVE
Protein,UA: NEGATIVE
Specific Gravity, UA: 1.025 (ref 1.005–1.030)
Urobilinogen, Ur: 0.2 mg/dL (ref 0.2–1.0)
pH, UA: 6 (ref 5.0–7.5)

## 2024-05-10 ENCOUNTER — Ambulatory Visit
Admission: RE | Admit: 2024-05-10 | Discharge: 2024-05-10 | Disposition: A | Discharge status: Home / Self Care | Source: Ambulatory Visit | Attending: Urology | Admitting: Urology

## 2024-05-10 DIAGNOSIS — R310 Gross hematuria: Secondary | ICD-10-CM | POA: Diagnosis not present

## 2024-05-10 DIAGNOSIS — N281 Cyst of kidney, acquired: Secondary | ICD-10-CM | POA: Diagnosis not present

## 2024-05-10 DIAGNOSIS — N200 Calculus of kidney: Secondary | ICD-10-CM | POA: Diagnosis not present

## 2024-05-10 DIAGNOSIS — N400 Enlarged prostate without lower urinary tract symptoms: Secondary | ICD-10-CM | POA: Diagnosis not present

## 2024-05-10 DIAGNOSIS — N320 Bladder-neck obstruction: Secondary | ICD-10-CM | POA: Diagnosis not present

## 2024-05-10 MED ORDER — IOHEXOL 300 MG/ML  SOLN
125.0000 mL | Freq: Once | INTRAMUSCULAR | Status: AC | PRN
  Administered 2024-05-10: 125 mL via INTRAVENOUS

## 2024-05-11 ENCOUNTER — Ambulatory Visit: Payer: Self-pay | Admitting: Urology

## 2024-09-05 ENCOUNTER — Ambulatory Visit: Admitting: Urology

## 2024-09-07 ENCOUNTER — Ambulatory Visit: Admitting: Urology
